# Patient Record
Sex: Female | Born: 1970 | Race: White | Hispanic: No | Smoking: Never smoker
Health system: Southern US, Community
[De-identification: ages and names within clinical notes are randomized; demographics above are authoritative.]

## PROBLEM LIST (undated history)

## (undated) DIAGNOSIS — T753XXA Motion sickness, initial encounter: Secondary | ICD-10-CM

## (undated) DIAGNOSIS — M1990 Unspecified osteoarthritis, unspecified site: Secondary | ICD-10-CM

## (undated) DIAGNOSIS — J302 Other seasonal allergic rhinitis: Secondary | ICD-10-CM

## (undated) DIAGNOSIS — Q639 Congenital malformation of kidney, unspecified: Secondary | ICD-10-CM

## (undated) DIAGNOSIS — I82409 Acute embolism and thrombosis of unspecified deep veins of unspecified lower extremity: Secondary | ICD-10-CM

## (undated) DIAGNOSIS — N3010 Interstitial cystitis (chronic) without hematuria: Secondary | ICD-10-CM

## (undated) DIAGNOSIS — D682 Hereditary deficiency of other clotting factors: Secondary | ICD-10-CM

## (undated) DIAGNOSIS — R42 Dizziness and giddiness: Secondary | ICD-10-CM

## (undated) DIAGNOSIS — F329 Major depressive disorder, single episode, unspecified: Secondary | ICD-10-CM

## (undated) DIAGNOSIS — F32A Depression, unspecified: Secondary | ICD-10-CM

## (undated) DIAGNOSIS — N83209 Unspecified ovarian cyst, unspecified side: Secondary | ICD-10-CM

---
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
## 2007-02-25 ENCOUNTER — Ambulatory Visit: Payer: Self-pay | Admitting: Family Medicine

## 2007-04-11 ENCOUNTER — Ambulatory Visit: Payer: Self-pay | Admitting: Specialist

## 2007-04-12 ENCOUNTER — Other Ambulatory Visit: Payer: Self-pay

## 2007-04-12 ENCOUNTER — Inpatient Hospital Stay: Payer: Self-pay | Admitting: Endocrinology

## 2007-05-05 ENCOUNTER — Ambulatory Visit: Payer: Self-pay | Admitting: Endocrinology

## 2007-10-17 ENCOUNTER — Encounter: Payer: Self-pay | Admitting: Maternal & Fetal Medicine

## 2008-05-02 ENCOUNTER — Ambulatory Visit: Payer: Self-pay | Admitting: Endocrinology

## 2008-07-20 ENCOUNTER — Emergency Department: Payer: Self-pay | Admitting: Emergency Medicine

## 2009-12-23 ENCOUNTER — Ambulatory Visit: Payer: Self-pay | Admitting: Unknown Physician Specialty

## 2010-01-06 ENCOUNTER — Ambulatory Visit: Payer: Self-pay | Admitting: Urology

## 2010-01-07 ENCOUNTER — Ambulatory Visit: Payer: Self-pay | Admitting: Urology

## 2012-11-10 ENCOUNTER — Ambulatory Visit: Payer: Self-pay | Admitting: Hematology and Oncology

## 2012-11-10 ENCOUNTER — Emergency Department: Payer: Self-pay | Admitting: Emergency Medicine

## 2012-11-10 ENCOUNTER — Ambulatory Visit: Payer: Self-pay | Admitting: General Practice

## 2012-11-10 LAB — BASIC METABOLIC PANEL
BUN: 16 mg/dL (ref 7–18)
Calcium, Total: 9.3 mg/dL (ref 8.5–10.1)
Chloride: 103 mmol/L (ref 98–107)
Co2: 32 mmol/L (ref 21–32)
EGFR (African American): >60
Glucose: 88 mg/dL (ref 65–99)
Sodium: 139 mmol/L (ref 136–145)

## 2012-11-10 LAB — PROTIME-INR
INR: 0.9
Prothrombin Time: 12.4 secs (ref 11.5–14.7)

## 2012-11-10 LAB — CBC
HCT: 39 % (ref 35.0–47.0)
Platelet: 240 10*3/uL (ref 150–440)
RBC: 4.27 10*6/uL (ref 3.80–5.20)

## 2012-11-10 LAB — APTT: Activated PTT: 27.5 secs (ref 23.6–35.9)

## 2012-11-16 ENCOUNTER — Ambulatory Visit: Payer: Self-pay | Admitting: Hematology and Oncology

## 2012-12-11 ENCOUNTER — Ambulatory Visit: Payer: Self-pay | Admitting: Hematology and Oncology

## 2013-02-13 ENCOUNTER — Ambulatory Visit: Payer: Self-pay | Admitting: General Practice

## 2013-06-26 ENCOUNTER — Ambulatory Visit: Payer: Self-pay | Admitting: Hematology and Oncology

## 2013-06-26 LAB — CBC CANCER CENTER
Basophil #: 0.1 x10 3/mm (ref 0.0–0.1)
Basophil %: 1.3 %
Eosinophil %: 2 %
HCT: 39.5 % (ref 35.0–47.0)
HGB: 13.3 g/dL (ref 12.0–16.0)
Lymphocyte #: 1.6 x10 3/mm (ref 1.0–3.6)
Lymphocyte %: 21.6 %
MCH: 31.4 pg (ref 26.0–34.0)
MCHC: 33.8 g/dL (ref 32.0–36.0)
MCV: 93 fL (ref 80–100)
Monocyte %: 5 %
Neutrophil #: 5.2 x10 3/mm (ref 1.4–6.5)
Neutrophil %: 70.1 %
RDW: 12.1 % (ref 11.5–14.5)

## 2013-06-26 LAB — COMPREHENSIVE METABOLIC PANEL
BUN: 11 mg/dL (ref 7–18)
Co2: 30 mmol/L (ref 21–32)
Creatinine: 1.01 mg/dL (ref 0.60–1.30)
EGFR (African American): >60
EGFR (Non-African Amer.): >60
Glucose: 104 mg/dL — ABNORMAL HIGH (ref 65–99)
Potassium: 3.9 mmol/L (ref 3.5–5.1)
SGOT(AST): 23 U/L (ref 15–37)
SGPT (ALT): 24 U/L (ref 12–78)
Sodium: 142 mmol/L (ref 136–145)

## 2013-07-13 ENCOUNTER — Ambulatory Visit: Payer: Self-pay | Admitting: Hematology and Oncology

## 2013-08-28 ENCOUNTER — Ambulatory Visit: Payer: Self-pay | Admitting: Hematology and Oncology

## 2013-08-28 LAB — CBC CANCER CENTER
BASOS PCT: 1 %
Basophil #: 0.1 x10 3/mm (ref 0.0–0.1)
Eosinophil #: 0 x10 3/mm (ref 0.0–0.7)
Eosinophil %: 0.3 %
HCT: 41.5 % (ref 35.0–47.0)
HGB: 14 g/dL (ref 12.0–16.0)
LYMPHS ABS: 1.7 x10 3/mm (ref 1.0–3.6)
LYMPHS PCT: 19.1 %
MCH: 31.1 pg (ref 26.0–34.0)
MCHC: 33.7 g/dL (ref 32.0–36.0)
MCV: 92 fL (ref 80–100)
MONO ABS: 0.4 x10 3/mm (ref 0.2–0.9)
Monocyte %: 4.7 %
Neutrophil #: 6.6 x10 3/mm — ABNORMAL HIGH (ref 1.4–6.5)
Neutrophil %: 74.9 %
PLATELETS: 245 x10 3/mm (ref 150–440)
RBC: 4.49 10*6/uL (ref 3.80–5.20)
RDW: 12.2 % (ref 11.5–14.5)
WBC: 8.8 x10 3/mm (ref 3.6–11.0)

## 2013-09-10 ENCOUNTER — Ambulatory Visit: Payer: Self-pay | Admitting: Hematology and Oncology

## 2013-11-19 LAB — CBC
HCT: 44.4 % (ref 35.0–47.0)
HGB: 15.2 g/dL (ref 12.0–16.0)
MCH: 31.6 pg (ref 26.0–34.0)
MCHC: 34.1 g/dL (ref 32.0–36.0)
MCV: 93 fL (ref 80–100)
PLATELETS: 258 10*3/uL (ref 150–440)
RBC: 4.79 10*6/uL (ref 3.80–5.20)
RDW: 12.3 % (ref 11.5–14.5)
WBC: 8.8 10*3/uL (ref 3.6–11.0)

## 2013-11-19 LAB — COMPREHENSIVE METABOLIC PANEL
ALK PHOS: 65 U/L
ALT: 17 U/L (ref 12–78)
ANION GAP: 6 — AB (ref 7–16)
Albumin: 4 g/dL (ref 3.4–5.0)
BUN: 14 mg/dL (ref 7–18)
Bilirubin,Total: 1 mg/dL (ref 0.2–1.0)
CHLORIDE: 99 mmol/L (ref 98–107)
CREATININE: 1.02 mg/dL (ref 0.60–1.30)
Calcium, Total: 9.3 mg/dL (ref 8.5–10.1)
Co2: 31 mmol/L (ref 21–32)
EGFR (Non-African Amer.): >60
GLUCOSE: 92 mg/dL (ref 65–99)
Osmolality: 272 (ref 275–301)
Potassium: 3.6 mmol/L (ref 3.5–5.1)
SGOT(AST): 22 U/L (ref 15–37)
SODIUM: 136 mmol/L (ref 136–145)
Total Protein: 7.7 g/dL (ref 6.4–8.2)

## 2013-11-19 LAB — DRUG SCREEN, URINE

## 2013-11-19 LAB — URINALYSIS, COMPLETE
Bilirubin,UR: NEGATIVE
GLUCOSE, UR: NEGATIVE mg/dL (ref 0–75)
Ketone: NEGATIVE
LEUKOCYTE ESTERASE: NEGATIVE
NITRITE: NEGATIVE
Ph: 6 (ref 4.5–8.0)
Protein: NEGATIVE
RBC,UR: <1 /HPF (ref 0–5)
Specific Gravity: 1.001 (ref 1.003–1.030)
Squamous Epithelial: 1
WBC UR: 1 /HPF (ref 0–5)

## 2013-11-19 LAB — ETHANOL
Ethanol %: <0.003 % (ref 0.000–0.080)
Ethanol: <3 mg/dL

## 2013-11-19 LAB — SALICYLATE LEVEL

## 2013-11-19 LAB — ACETAMINOPHEN LEVEL: Acetaminophen: <2 ug/mL

## 2013-11-20 ENCOUNTER — Inpatient Hospital Stay: Payer: Self-pay | Admitting: Psychiatry

## 2013-11-23 LAB — HEMOGLOBIN: HGB: 13.2 g/dL (ref 12.0–16.0)

## 2013-11-23 LAB — CREATININE, SERUM
Creatinine: 0.8 mg/dL (ref 0.60–1.30)
EGFR (Non-African Amer.): >60

## 2013-12-28 ENCOUNTER — Ambulatory Visit: Payer: Self-pay | Admitting: Hematology and Oncology

## 2013-12-28 LAB — CBC CANCER CENTER
BASOS ABS: 0.1 x10 3/mm (ref 0.0–0.1)
Basophil %: 1.2 %
EOS PCT: 2.2 %
Eosinophil #: 0.1 x10 3/mm (ref 0.0–0.7)
HCT: 38.9 % (ref 35.0–47.0)
HGB: 13 g/dL (ref 12.0–16.0)
Lymphocyte #: 1.6 x10 3/mm (ref 1.0–3.6)
Lymphocyte %: 26.1 %
MCH: 31.2 pg (ref 26.0–34.0)
MCHC: 33.3 g/dL (ref 32.0–36.0)
MCV: 94 fL (ref 80–100)
Monocyte #: 0.4 x10 3/mm (ref 0.2–0.9)
Monocyte %: 6.9 %
NEUTROS ABS: 3.8 x10 3/mm (ref 1.4–6.5)
NEUTROS PCT: 63.6 %
PLATELETS: 222 x10 3/mm (ref 150–440)
RBC: 4.16 10*6/uL (ref 3.80–5.20)
RDW: 12.2 % (ref 11.5–14.5)
WBC: 6 x10 3/mm (ref 3.6–11.0)

## 2014-01-10 ENCOUNTER — Ambulatory Visit: Payer: Self-pay | Admitting: Hematology and Oncology

## 2014-11-03 NOTE — Discharge Summary (Signed)
PATIENT NAME:  Jocelyn Palmer, [name] MR#:  [id number] DATE OF BIRTH:  [birth year]  DATE OF ADMISSION:  11/20/2013 DATE OF DISCHARGE:  11/24/2013  HOSPITAL COURSE: See dictated history and physical for details of admission. This 44 year old woman presented to the hospital with depression, anxiety, feeling out of control, symptoms that vaguely suggested the possibility of suicidality. She was feeling like she was having a breakdown. In the hospital, the patient did not engage in any suicidal or violent behavior. She was cooperative with medication, treatment, as well as individual and group psychotherapy. She appears to have symptoms of severe anxiety, as well as depression. I do not think she has a psychotic disorder. I think the symptoms that she perceives as possibly psychotic were more dissociative and extreme obsessive anxiety. She has been treated here with serotonin reuptake inhibitors and Seroquel as primary medications. She has tolerated them well. She had complaints regularly of sleeping poorly, but last night slept much better on the increased dose of Seroquel. I have discussed the overall treatment plan with the patient, and I think that although she continues to have some symptoms, that she would be better off being discharged. She has a safe home to return to where she feels secure and has a supportive husband. She has outpatient treatment already in place. She is not abusing substances at home. I think that the inpatient environment is probably causing more stress for her than is necessary. She agrees with all of this. She has been educated about medicine and the importance of staying compliant and staying compliant with outpatient treatment and focusing on calming down and improving herself. She gave me some FMLA paperwork while she was here, and I have filled that out suggesting she will need to be out for up to 3 months.   DISPOSITION: Discharge home with family. Follow up at [facility] with Dr.  Jennelle Humanottle.   DISCHARGE MEDICATIONS: Seroquel 300 mg p.o. at bedtime, temazepam 15 mg at night p.r.n. sleep, fluoxetine 20 mg a day, and Xarelto 20 mg once a day.   LABORATORY RESULTS: As noted previously, admission labs largely unremarkable. Drug screen negative. Urinalysis borderline. CBC normal. Chemistry panel normal.   MENTAL STATUS EXAMINATION AT DISCHARGE: Neatly dressed and groomed woman who looks her stated age. Cooperative during the interview. Good eye contact. Psychomotor activity a little restricted. Speech slightly decreased in total amount but normal in tone, easy to understand. Affect still slightly blunted and anxious. Mood stated as nervous. Thoughts are lucid without loosening of associations or delusions. Denies auditory or visual hallucinations. Denies any suicidal or homicidal ideation. Articulates positive plans for the future and things to live for. Judgment and insight improved. Intelligence normal. Short and long-term memory intact. Normal fund of knowledge. Alert and oriented x 4.   DIAGNOSIS, PRINCIPAL AND PRIMARY:  AXIS I: Major depression, recurrent, severe.   SECONDARY DIAGNOSES: AXIS I: Generalized anxiety disorder.  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  AXIS IV: Moderate to severe from being out of work and the level of illness.  AXIS V: Functioning at time of discharge 50.   ____________________________ Audery AmelJohn T. Zyen Triggs, MD jtc:jcm D: 11/24/2013 12:48:09 ET T: 11/24/2013 21:00:47 ET JOB#: [id number]  cc: Audery AmelJohn T. Tamiya Colello, MD, <Dictator> Audery AmelJOHN T Eveleen Mcnear MD ELECTRONICALLY SIGNED 12/14/2013 11:32
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
## 2015-01-02 ENCOUNTER — Emergency Department (HOSPITAL_COMMUNITY): Payer: 59

## 2015-01-02 ENCOUNTER — Emergency Department (HOSPITAL_COMMUNITY)
Admission: EM | Admit: 2015-01-02 | Discharge: 2015-01-03 | Disposition: A | Discharge status: Home / Self Care | Payer: 59 | Attending: Emergency Medicine | Admitting: Emergency Medicine

## 2015-01-02 ENCOUNTER — Encounter (HOSPITAL_COMMUNITY): Payer: Self-pay | Admitting: Nurse Practitioner

## 2015-01-02 DIAGNOSIS — S060X9A Concussion with loss of consciousness of unspecified duration, initial encounter: Secondary | ICD-10-CM | POA: Diagnosis not present

## 2015-01-02 DIAGNOSIS — Y9389 Activity, other specified: Secondary | ICD-10-CM | POA: Insufficient documentation

## 2015-01-02 DIAGNOSIS — S01511A Laceration without foreign body of lip, initial encounter: Secondary | ICD-10-CM | POA: Insufficient documentation

## 2015-01-02 DIAGNOSIS — S50312A Abrasion of left elbow, initial encounter: Secondary | ICD-10-CM | POA: Diagnosis not present

## 2015-01-02 DIAGNOSIS — Y92410 Unspecified street and highway as the place of occurrence of the external cause: Secondary | ICD-10-CM | POA: Insufficient documentation

## 2015-01-02 DIAGNOSIS — R110 Nausea: Secondary | ICD-10-CM | POA: Insufficient documentation

## 2015-01-02 DIAGNOSIS — S80212A Abrasion, left knee, initial encounter: Secondary | ICD-10-CM | POA: Diagnosis not present

## 2015-01-02 DIAGNOSIS — Z86718 Personal history of other venous thrombosis and embolism: Secondary | ICD-10-CM | POA: Diagnosis not present

## 2015-01-02 DIAGNOSIS — S0181XA Laceration without foreign body of other part of head, initial encounter: Secondary | ICD-10-CM

## 2015-01-02 DIAGNOSIS — S80211A Abrasion, right knee, initial encounter: Secondary | ICD-10-CM | POA: Diagnosis not present

## 2015-01-02 DIAGNOSIS — S0990XA Unspecified injury of head, initial encounter: Secondary | ICD-10-CM | POA: Diagnosis present

## 2015-01-02 DIAGNOSIS — F329 Major depressive disorder, single episode, unspecified: Secondary | ICD-10-CM | POA: Diagnosis not present

## 2015-01-02 DIAGNOSIS — S01112A Laceration without foreign body of left eyelid and periocular area, initial encounter: Secondary | ICD-10-CM | POA: Diagnosis not present

## 2015-01-02 DIAGNOSIS — Z79899 Other long term (current) drug therapy: Secondary | ICD-10-CM | POA: Insufficient documentation

## 2015-01-02 DIAGNOSIS — S0992XA Unspecified injury of nose, initial encounter: Secondary | ICD-10-CM | POA: Insufficient documentation

## 2015-01-02 DIAGNOSIS — Z862 Personal history of diseases of the blood and blood-forming organs and certain disorders involving the immune mechanism: Secondary | ICD-10-CM | POA: Insufficient documentation

## 2015-01-02 DIAGNOSIS — S40812A Abrasion of left upper arm, initial encounter: Secondary | ICD-10-CM | POA: Diagnosis not present

## 2015-01-02 DIAGNOSIS — R52 Pain, unspecified: Secondary | ICD-10-CM

## 2015-01-02 DIAGNOSIS — Y999 Unspecified external cause status: Secondary | ICD-10-CM | POA: Insufficient documentation

## 2015-01-02 HISTORY — DX: Major depressive disorder, single episode, unspecified: F32.9

## 2015-01-02 HISTORY — DX: Depression, unspecified: F32.A

## 2015-01-02 HISTORY — DX: Hereditary deficiency of other clotting factors: D68.2

## 2015-01-02 LAB — CBC
HCT: 38.3 % (ref 36.0–46.0)
HEMOGLOBIN: 13 g/dL (ref 12.0–15.0)
MCH: 30.8 pg (ref 26.0–34.0)
MCHC: 33.9 g/dL (ref 30.0–36.0)
MCV: 90.8 fL (ref 78.0–100.0)
Platelets: 215 10*3/uL (ref 150–400)
RBC: 4.22 MIL/uL (ref 3.87–5.11)
RDW: 12.1 % (ref 11.5–15.5)
WBC: 7.7 10*3/uL (ref 4.0–10.5)

## 2015-01-02 LAB — COMPREHENSIVE METABOLIC PANEL
ALT: 29 U/L (ref 14–54)
ANION GAP: 12 (ref 5–15)
AST: 41 U/L (ref 15–41)
Albumin: 4.2 g/dL (ref 3.5–5.0)
Alkaline Phosphatase: 58 U/L (ref 38–126)
BUN: 17 mg/dL (ref 6–20)
CALCIUM: 9.6 mg/dL (ref 8.9–10.3)
CO2: 24 mmol/L (ref 22–32)
CREATININE: 1.3 mg/dL — AB (ref 0.44–1.00)
Chloride: 103 mmol/L (ref 101–111)
GFR calc non Af Amer: 49 mL/min — ABNORMAL LOW (ref >60)
GFR, EST AFRICAN AMERICAN: 57 mL/min — AB (ref >60)
GLUCOSE: 147 mg/dL — AB (ref 65–99)
Potassium: 4.1 mmol/L (ref 3.5–5.1)
Sodium: 139 mmol/L (ref 135–145)
TOTAL PROTEIN: 7 g/dL (ref 6.5–8.1)
Total Bilirubin: 0.5 mg/dL (ref 0.3–1.2)

## 2015-01-02 LAB — SAMPLE TO BLOOD BANK

## 2015-01-02 LAB — I-STAT CG4 LACTIC ACID, ED: Lactic Acid, Venous: 2.13 mmol/L (ref 0.5–2.0)

## 2015-01-02 LAB — ETHANOL: Alcohol, Ethyl (B): <5 mg/dL (ref <5)

## 2015-01-02 LAB — PROTIME-INR
INR: 0.98 (ref 0.00–1.49)
Prothrombin Time: 13.2 seconds (ref 11.6–15.2)

## 2015-01-02 MED ORDER — TETANUS-DIPHTH-ACELL PERTUSSIS 5-2.5-18.5 LF-MCG/0.5 IM SUSP
0.5000 mL | Freq: Once | INTRAMUSCULAR | Status: AC
  Administered 2015-01-02: 0.5 mL via INTRAMUSCULAR
  Filled 2015-01-02: qty 0.5

## 2015-01-02 MED ORDER — SODIUM CHLORIDE 0.9 % IV BOLUS (SEPSIS)
1000.0000 mL | Freq: Once | INTRAVENOUS | Status: AC
  Administered 2015-01-02: 1000 mL via INTRAVENOUS

## 2015-01-02 MED ORDER — ONDANSETRON HCL 4 MG/2ML IJ SOLN
4.0000 mg | Freq: Once | INTRAMUSCULAR | Status: AC
  Administered 2015-01-02: 4 mg via INTRAVENOUS
  Filled 2015-01-02: qty 2

## 2015-01-02 MED ORDER — LIDOCAINE-EPINEPHRINE 1 %-1:100000 IJ SOLN
10.0000 mL | Freq: Once | INTRAMUSCULAR | Status: AC
  Administered 2015-01-02: 10 mL via INTRADERMAL
  Filled 2015-01-02: qty 1

## 2015-01-02 MED ORDER — ACETAMINOPHEN 500 MG PO TABS
1000.0000 mg | ORAL_TABLET | Freq: Once | ORAL | Status: AC
  Administered 2015-01-02: 1000 mg via ORAL
  Filled 2015-01-02: qty 2

## 2015-01-02 MED ORDER — ONDANSETRON HCL 4 MG/2ML IJ SOLN
4.0000 mg | Freq: Once | INTRAMUSCULAR | Status: AC
Start: 2015-01-02 — End: 2015-01-02
  Administered 2015-01-02: 4 mg via INTRAVENOUS
  Filled 2015-01-02: qty 2

## 2015-01-02 MED ORDER — MORPHINE SULFATE 2 MG/ML IJ SOLN
2.0000 mg | Freq: Once | INTRAMUSCULAR | Status: AC
  Administered 2015-01-02: 2 mg via INTRAVENOUS
  Filled 2015-01-02: qty 1

## 2015-01-02 NOTE — ED Notes (Signed)
Family at bedside. 

## 2015-01-02 NOTE — ED Notes (Signed)
Patient transported to CT 

## 2015-01-02 NOTE — ED Notes (Signed)
Pt returned with this RN from CT
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
## 2015-01-03 LAB — CDS SEROLOGY

## 2015-01-03 MED ORDER — ACETAMINOPHEN 500 MG PO TABS: 1000.0000 mg | ORAL_TABLET | Freq: Three times a day (TID) | ORAL | Status: AC | PRN

## 2015-01-03 NOTE — ED Notes (Signed)
Suture cart bedside. 

## 2015-01-03 NOTE — Discharge Instructions (Signed)
Absorbable Suture Repair Absorbable sutures (stitches) hold skin together so you can heal. Keep skin wounds clean and dry for the next 2 to 3 days. Then, you may gently wash your wound and dress it with an antibiotic ointment as recommended. As your wound begins to heal, the sutures are no longer needed, and they typically begin to fall off. This will take 7 to 10 days. After 10 days, if your sutures are loose, you can remove them by wiping with a clean gauze pad or a cotton ball. Do not pull your sutures out. They should wipe away easily. If after 10 days they do not easily wipe away, have your caregiver take them out. Absorbable sutures may be used deep in a wound to help hold it together. If these stitches are below the skin, the body will absorb them completely in 3 to 4 weeks.  You may need a tetanus shot if:  You cannot remember when you had your last tetanus shot.  You have never had a tetanus shot. If you get a tetanus shot, your arm may swell, get red, and feel warm to the touch. This is common and not a problem. If you need a tetanus shot and you choose not to have one, there is a rare chance of getting tetanus. Sickness from tetanus can be serious. SEEK IMMEDIATE MEDICAL CARE IF:  You have redness in the wound area.  The wound area feels hot to the touch.  You develop swelling in the wound area.  You develop pain.  There is fluid drainage from the wound. Document Released: 08/06/2004 Document Revised: 09/21/2011 Document Reviewed: 11/18/2010 [facility] Patient Information 2015 [place], [place]. This information is not intended to replace advice given to you by your health care provider. Make sure you discuss any questions you have with your health care provider. Laceration Care, Adult A laceration is a cut or lesion that goes through all layers of the skin and into the tissue just beneath the skin. TREATMENT  Some lacerations may not require closure. Some lacerations may not be able  to be closed due to an increased risk of infection. It is important to see your caregiver as soon as possible after an injury to minimize the risk of infection and maximize the opportunity for successful closure. If closure is appropriate, pain medicines may be given, if needed. The wound will be cleaned to help prevent infection. Your caregiver will use stitches (sutures), staples, wound glue (adhesive), or skin adhesive strips to repair the laceration. These tools bring the skin edges together to allow for faster healing and a better cosmetic outcome. However, all wounds will heal with a scar. Once the wound has healed, scarring can be minimized by covering the wound with sunscreen during the day for 1 full year. HOME CARE INSTRUCTIONS  For sutures or staples:  Keep the wound clean and dry.  If you were given a bandage (dressing), you should change it at least once a day. Also, change the dressing if it becomes wet or dirty, or as directed by your caregiver.  Wash the wound with soap and water 2 times a day. Rinse the wound off with water to remove all soap. Pat the wound dry with a clean towel.  After cleaning, apply a thin layer of the antibiotic ointment as recommended by your caregiver. This will help prevent infection and keep the dressing from sticking.  You may shower as usual after the first 24 hours. Do not soak the wound in  water until the sutures are removed.  Only take over-the-counter or prescription medicines for pain, discomfort, or fever as directed by your caregiver.  Get your sutures or staples removed as directed by your caregiver. For skin adhesive strips:  Keep the wound clean and dry.  Do not get the skin adhesive strips wet. You may bathe carefully, using caution to keep the wound dry.  If the wound gets wet, pat it dry with a clean towel.  Skin adhesive strips will fall off on their own. You may trim the strips as the wound heals. Do not remove skin adhesive strips  that are still stuck to the wound. They will fall off in time. For wound adhesive:  You may briefly wet your wound in the shower or bath. Do not soak or scrub the wound. Do not swim. Avoid periods of heavy perspiration until the skin adhesive has fallen off on its own. After showering or bathing, gently pat the wound dry with a clean towel.  Do not apply liquid medicine, cream medicine, or ointment medicine to your wound while the skin adhesive is in place. This may loosen the film before your wound is healed.  If a dressing is placed over the wound, be careful not to apply tape directly over the skin adhesive. This may cause the adhesive to be pulled off before the wound is healed.  Avoid prolonged exposure to sunlight or tanning lamps while the skin adhesive is in place. Exposure to ultraviolet light in the first year will darken the scar.  The skin adhesive will usually remain in place for 5 to 10 days, then naturally fall off the skin. Do not pick at the adhesive film. You may need a tetanus shot if:  You cannot remember when you had your last tetanus shot.  You have never had a tetanus shot. If you get a tetanus shot, your arm may swell, get red, and feel warm to the touch. This is common and not a problem. If you need a tetanus shot and you choose not to have one, there is a rare chance of getting tetanus. Sickness from tetanus can be serious. SEEK MEDICAL CARE IF:   You have redness, swelling, or increasing pain in the wound.  You see a red line that goes away from the wound.  You have yellowish-white fluid (pus) coming from the wound.  You have a fever.  You notice a bad smell coming from the wound or dressing.  Your wound breaks open before or after sutures have been removed.  You notice something coming out of the wound such as wood or glass.  Your wound is on your hand or foot and you cannot move a finger or toe. SEEK IMMEDIATE MEDICAL CARE IF:   Your pain is not  controlled with prescribed medicine.  You have severe swelling around the wound causing pain and numbness or a change in color in your arm, hand, leg, or foot.  Your wound splits open and starts bleeding.  You have worsening numbness, weakness, or loss of function of any joint around or beyond the wound.  You develop painful lumps near the wound or on the skin anywhere on your body. MAKE SURE YOU:   Understand these instructions.  Will watch your condition.  Will get help right away if you are not doing well or get worse. Document Released: 06/29/2005 Document Revised: 09/21/2011 Document Reviewed: 12/23/2010 [facility] Patient Information 2015 [place], [place]. This information is not intended to replace advice  given to you by your health care provider. Make sure you discuss any questions you have with your health care provider.

## 2015-01-03 NOTE — ED Notes (Signed)
Dr. Cardama at bedside.  

## 2015-01-03 NOTE — ED Notes (Signed)
Dr.Cardama at bedside; dermabond given

## 2015-01-08 ENCOUNTER — Ambulatory Visit
Admission: EM | Admit: 2015-01-08 | Discharge: 2015-01-08 | Disposition: A | Discharge status: Home / Self Care | Payer: 59

## 2015-01-08 HISTORY — DX: Unspecified ovarian cyst, unspecified side: N83.209

## 2015-01-08 HISTORY — DX: Interstitial cystitis (chronic) without hematuria: N30.10

## 2015-01-08 NOTE — ED Notes (Signed)
For suture removal above left eyebrow

## 2015-01-08 NOTE — ED Notes (Signed)
Family at bedside. Pt. Wounds checked. Has 2 disolvable sutures upper inside lip remain. Running sutures removed from left eyebrow easily-well healed. Remains with occasional dizziness. Dr. Judd Gaudieronty talked with patient and family
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
## 2015-01-10 ENCOUNTER — Other Ambulatory Visit: Payer: Self-pay | Admitting: Internal Medicine

## 2015-01-10 DIAGNOSIS — R42 Dizziness and giddiness: Secondary | ICD-10-CM

## 2015-01-18 ENCOUNTER — Ambulatory Visit
Admission: RE | Admit: 2015-01-18 | Discharge: 2015-01-18 | Disposition: A | Discharge status: Home / Self Care | Payer: 59 | Source: Ambulatory Visit | Attending: Internal Medicine | Admitting: Internal Medicine

## 2015-01-18 DIAGNOSIS — S0990XA Unspecified injury of head, initial encounter: Secondary | ICD-10-CM | POA: Diagnosis present

## 2015-01-18 DIAGNOSIS — R42 Dizziness and giddiness: Secondary | ICD-10-CM | POA: Diagnosis not present

## 2015-02-08 ENCOUNTER — Other Ambulatory Visit: Payer: Self-pay | Admitting: Orthopedic Surgery

## 2015-02-08 DIAGNOSIS — S46002A Unspecified injury of muscle(s) and tendon(s) of the rotator cuff of left shoulder, initial encounter: Secondary | ICD-10-CM

## 2015-02-15 ENCOUNTER — Ambulatory Visit
Admission: RE | Admit: 2015-02-15 | Discharge: 2015-02-15 | Disposition: A | Discharge status: Home / Self Care | Payer: 59 | Source: Ambulatory Visit | Attending: Orthopedic Surgery | Admitting: Orthopedic Surgery

## 2015-02-15 DIAGNOSIS — M67814 Other specified disorders of tendon, left shoulder: Secondary | ICD-10-CM | POA: Diagnosis not present

## 2015-02-15 DIAGNOSIS — M94212 Chondromalacia, left shoulder: Secondary | ICD-10-CM | POA: Diagnosis not present

## 2015-02-15 DIAGNOSIS — M25412 Effusion, left shoulder: Secondary | ICD-10-CM | POA: Diagnosis not present

## 2015-02-15 DIAGNOSIS — Y9355 Activity, bike riding: Secondary | ICD-10-CM | POA: Insufficient documentation

## 2015-02-15 DIAGNOSIS — S46002A Unspecified injury of muscle(s) and tendon(s) of the rotator cuff of left shoulder, initial encounter: Secondary | ICD-10-CM

## 2015-02-15 DIAGNOSIS — S4992XA Unspecified injury of left shoulder and upper arm, initial encounter: Secondary | ICD-10-CM | POA: Diagnosis present

## 2015-02-15 DIAGNOSIS — X58XXXA Exposure to other specified factors, initial encounter: Secondary | ICD-10-CM | POA: Diagnosis not present

## 2015-02-15 DIAGNOSIS — S46812A Strain of other muscles, fascia and tendons at shoulder and upper arm level, left arm, initial encounter: Secondary | ICD-10-CM | POA: Insufficient documentation

## 2015-03-11 ENCOUNTER — Encounter: Payer: Self-pay | Admitting: *Deleted

## 2015-03-20 ENCOUNTER — Ambulatory Visit: Payer: 59 | Admitting: Anesthesiology

## 2015-03-20 ENCOUNTER — Encounter
Admission: RE | Disposition: A | Discharge status: Home / Self Care | Payer: Self-pay | Source: Ambulatory Visit | Attending: Surgery

## 2015-03-20 ENCOUNTER — Ambulatory Visit
Admission: RE | Admit: 2015-03-20 | Discharge: 2015-03-20 | Disposition: A | Discharge status: Home / Self Care | Payer: 59 | Source: Ambulatory Visit | Attending: Surgery | Admitting: Surgery

## 2015-03-20 ENCOUNTER — Encounter: Payer: Self-pay | Admitting: Anesthesiology

## 2015-03-20 DIAGNOSIS — M7502 Adhesive capsulitis of left shoulder: Secondary | ICD-10-CM | POA: Diagnosis not present

## 2015-03-20 DIAGNOSIS — F419 Anxiety disorder, unspecified: Secondary | ICD-10-CM | POA: Diagnosis not present

## 2015-03-20 DIAGNOSIS — G43909 Migraine, unspecified, not intractable, without status migrainosus: Secondary | ICD-10-CM | POA: Diagnosis not present

## 2015-03-20 DIAGNOSIS — M75102 Unspecified rotator cuff tear or rupture of left shoulder, not specified as traumatic: Secondary | ICD-10-CM | POA: Insufficient documentation

## 2015-03-20 DIAGNOSIS — Z882 Allergy status to sulfonamides status: Secondary | ICD-10-CM | POA: Insufficient documentation

## 2015-03-20 DIAGNOSIS — F329 Major depressive disorder, single episode, unspecified: Secondary | ICD-10-CM | POA: Insufficient documentation

## 2015-03-20 DIAGNOSIS — M7542 Impingement syndrome of left shoulder: Secondary | ICD-10-CM | POA: Diagnosis present

## 2015-03-20 DIAGNOSIS — Z79899 Other long term (current) drug therapy: Secondary | ICD-10-CM | POA: Diagnosis not present

## 2015-03-20 DIAGNOSIS — Z8349 Family history of other endocrine, nutritional and metabolic diseases: Secondary | ICD-10-CM | POA: Insufficient documentation

## 2015-03-20 HISTORY — DX: Other seasonal allergic rhinitis: J30.2

## 2015-03-20 HISTORY — DX: Congenital malformation of kidney, unspecified: Q63.9

## 2015-03-20 HISTORY — DX: Motion sickness, initial encounter: T75.3XXA

## 2015-03-20 HISTORY — DX: Dizziness and giddiness: R42

## 2015-03-20 HISTORY — PX: SHOULDER ARTHROSCOPY WITH ROTATOR CUFF REPAIR: SHX5685

## 2015-03-20 HISTORY — DX: Acute embolism and thrombosis of unspecified deep veins of unspecified lower extremity: I82.409

## 2015-03-20 HISTORY — DX: Unspecified osteoarthritis, unspecified site: M19.90

## 2015-03-20 SURGERY — ARTHROSCOPY, SHOULDER, WITH ROTATOR CUFF REPAIR
Anesthesia: Regional | Laterality: Left | Wound class: Clean

## 2015-03-20 MED ORDER — OXYCODONE HCL 5 MG PO TABS: 5.0000 mg | ORAL_TABLET | ORAL | Status: DC | PRN

## 2015-03-20 MED ORDER — ROPIVACAINE HCL 5 MG/ML IJ SOLN
INTRAMUSCULAR | Status: DC | PRN
  Administered 2015-03-20 (×2): 20 mL via PERINEURAL

## 2015-03-20 MED ORDER — DEXAMETHASONE SODIUM PHOSPHATE 4 MG/ML IJ SOLN
INTRAMUSCULAR | Status: DC | PRN
  Administered 2015-03-20: 8 mg via INTRAVENOUS

## 2015-03-20 MED ORDER — FENTANYL CITRATE (PF) 100 MCG/2ML IJ SOLN
INTRAMUSCULAR | Status: DC | PRN
  Administered 2015-03-20: 100 ug via INTRAVENOUS

## 2015-03-20 MED ORDER — LIDOCAINE HCL (CARDIAC) 20 MG/ML IV SOLN
INTRAVENOUS | Status: DC | PRN
  Administered 2015-03-20: 40 mg via INTRATRACHEAL

## 2015-03-20 MED ORDER — LACTATED RINGERS IV SOLN
INTRAVENOUS | Status: DC
  Administered 2015-03-20: 13:00:00 via INTRAVENOUS

## 2015-03-20 MED ORDER — MIDAZOLAM HCL 2 MG/2ML IJ SOLN
INTRAMUSCULAR | Status: DC | PRN
  Administered 2015-03-20: 2 mg via INTRAVENOUS

## 2015-03-20 MED ORDER — PROPOFOL 10 MG/ML IV BOLUS
INTRAVENOUS | Status: DC | PRN
  Administered 2015-03-20: 150 mg via INTRAVENOUS
  Administered 2015-03-20: 50 mg via INTRAVENOUS

## 2015-03-20 MED ORDER — BUPIVACAINE-EPINEPHRINE (PF) 0.5% -1:200000 IJ SOLN
INTRAMUSCULAR | Status: DC | PRN
  Administered 2015-03-20: 22 mL via PERINEURAL

## 2015-03-20 MED ORDER — ONDANSETRON HCL 4 MG/2ML IJ SOLN
INTRAMUSCULAR | Status: DC | PRN
  Administered 2015-03-20: 4 mg via INTRAVENOUS

## 2015-03-20 MED ORDER — CEFAZOLIN SODIUM-DEXTROSE 2-3 GM-% IV SOLR
2.0000 g | Freq: Once | INTRAVENOUS | Status: AC
  Administered 2015-03-20: 2 g via INTRAVENOUS

## 2015-03-20 SURGICAL SUPPLY — 54 items
ANCHOR JUGGERKNOT WTAP NDL 2.9 (Anchor) ×4 IMPLANT
BENZOIN TINCTURE PRP APPL 2/3 (GAUZE/BANDAGES/DRESSINGS) IMPLANT
BIT DRILL JUGRKNT W/NDL BIT2.9 (DRILL) ×1 IMPLANT
BLADE FULL RADIUS 3.5 (BLADE) ×2 IMPLANT
BLADE SHAVER 4.5X7 STR FR (MISCELLANEOUS) IMPLANT
BNDG COHESIVE 4X5 TAN STRL (GAUZE/BANDAGES/DRESSINGS) ×2 IMPLANT
BNDG ESMARK 4X12 TAN STRL LF (GAUZE/BANDAGES/DRESSINGS) IMPLANT
BUR ACROMIONIZER 4.0 (BURR) ×2 IMPLANT
BUR BR 5.5 WIDE MOUTH (BURR) IMPLANT
CANISTER SUCT 1200ML W/VALVE (MISCELLANEOUS) ×2 IMPLANT
CHLORAPREP W/TINT 26ML (MISCELLANEOUS) ×4 IMPLANT
COVER LIGHT HANDLE UNIVERSAL (MISCELLANEOUS) ×2 IMPLANT
DRAPE FLUOR MINI C-ARM 54X84 (DRAPES) IMPLANT
DRAPE IMP U-DRAPE 54X76 (DRAPES) ×4 IMPLANT
DRAPE U-SHAPE 48X52 POLY STRL (PACKS) IMPLANT
DRILL JUGGERKNOT W/NDL BIT 2.9 (DRILL) ×2
GAUZE PETRO XEROFOAM 1X8 (MISCELLANEOUS) ×2 IMPLANT
GAUZE SPONGE 4X4 12PLY STRL (GAUZE/BANDAGES/DRESSINGS) ×2 IMPLANT
GLOVE BIO SURGEON STRL SZ8 (GLOVE) ×4 IMPLANT
GLOVE INDICATOR 8.0 STRL GRN (GLOVE) ×2 IMPLANT
GOWN STRL REUS W/ TWL LRG LVL3 (GOWN DISPOSABLE) ×2 IMPLANT
GOWN STRL REUS W/ TWL XL LVL3 (GOWN DISPOSABLE) ×1 IMPLANT
GOWN STRL REUS W/TWL LRG LVL3 (GOWN DISPOSABLE) ×2
GOWN STRL REUS W/TWL XL LVL3 (GOWN DISPOSABLE) ×1
IV LACTATED RINGER IRRG 3000ML (IV SOLUTION) ×2
IV LR IRRIG 3000ML ARTHROMATIC (IV SOLUTION) ×2 IMPLANT
KIT CANNULA 8X76-LX IN CANNULA (CANNULA) ×2 IMPLANT
MANIFOLD 4PT FOR NEPTUNE1 (MISCELLANEOUS) ×2 IMPLANT
MAT BLUE FLOOR 46X72 FLO (MISCELLANEOUS) ×2 IMPLANT
NDL MAYO CATGUT SZ5 (NEEDLE)
NDL SUT 5 .5 CRC TPR PNT MAYO (NEEDLE) IMPLANT
NEEDLE HYPO 21X1.5 SAFETY (NEEDLE) ×4 IMPLANT
NS IRRIG 500ML POUR BTL (IV SOLUTION) IMPLANT
PACK ARTHROSCOPY SHOULDER (MISCELLANEOUS) ×2 IMPLANT
PACK EXTREMITY ARMC (MISCELLANEOUS) IMPLANT
PAD CAST CTTN 4X4 STRL (SOFTGOODS) IMPLANT
PAD GROUND ADULT SPLIT (MISCELLANEOUS) ×2 IMPLANT
PADDING CAST COTTON 4X4 STRL (SOFTGOODS)
SLING ULTRA II M (MISCELLANEOUS) ×2 IMPLANT
STAPLER SKIN PROX 35W (STAPLE) ×2 IMPLANT
STOCKINETTE IMPERVIOUS LG (DRAPES) ×2 IMPLANT
STRAP BODY AND KNEE 60X3 (MISCELLANEOUS) ×4 IMPLANT
STRIP CLOSURE SKIN 1/2X4 (GAUZE/BANDAGES/DRESSINGS) ×2 IMPLANT
SUT ETHIBOND 0 MO6 C/R (SUTURE) IMPLANT
SUT VIC AB 2-0 CT1 27 (SUTURE)
SUT VIC AB 2-0 CT1 TAPERPNT 27 (SUTURE) IMPLANT
SUT VIC AB 2-0 SH 27 (SUTURE) ×1
SUT VIC AB 2-0 SH 27XBRD (SUTURE) ×1 IMPLANT
SUT VIC AB 3-0 SH 27 (SUTURE)
SUT VIC AB 3-0 SH 27X BRD (SUTURE) IMPLANT
SYR 30ML LL (SYRINGE) IMPLANT
TAPE MICROFOAM 4IN (TAPE) ×2 IMPLANT
TUBING ARTHRO INFLOW-ONLY STRL (TUBING) ×2 IMPLANT
WAND HAND CNTRL MULTIVAC 90 (MISCELLANEOUS) ×2 IMPLANT
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
## 2015-03-20 NOTE — Transfer of Care (Signed)
Immediate Anesthesia Transfer of Care Note  Patient: Jocelyn Palmer  Procedure(s) Performed: Procedure(s): SHOULDER ARTHROSCOPY DEBRIDEMENT DECOMPRESSION WITH ROTATOR CUFF REPAIR (Left)  Patient Location: PACU  Anesthesia Type: Regional, General  Level of Consciousness: awake, alert  and patient cooperative  Airway and Oxygen Therapy: Patient Spontanous Breathing and Patient connected to supplemental oxygen  Post-op Assessment: Post-op Vital signs reviewed, Patient's Cardiovascular Status Stable, Respiratory Function Stable, Patent Airway and No signs of Nausea or vomiting  Post-op Vital Signs: Reviewed and stable  Complications: No apparent anesthesia complications

## 2015-03-20 NOTE — H&P (Signed)
Paper H&P to be scanned into permanent record. H&P reviewed. No changes. 

## 2015-03-20 NOTE — Discharge Instructions (Signed)
General Anesthesia, Care After °Refer to this sheet in the next few weeks. These instructions provide you with information on caring for yourself after your procedure. Your health care provider may also give you more specific instructions. Your treatment has been planned according to current medical practices, but problems sometimes occur. Call your health care provider if you have any problems or questions after your procedure. °WHAT TO EXPECT AFTER THE PROCEDURE °After the procedure, it is typical to experience: °· Sleepiness. °· Nausea and vomiting. °HOME CARE INSTRUCTIONS °· For the first 24 hours after general anesthesia: °¨ Have a responsible person with you. °¨ Do not drive a car. If you are alone, do not take public transportation. °¨ Do not drink alcohol. °¨ Do not take medicine that has not been prescribed by your health care provider. °¨ Do not sign important papers or make important decisions. °¨ You may resume a normal diet and activities as directed by your health care provider. °· Change bandages (dressings) as directed. °· If you have questions or problems that seem related to general anesthesia, call the hospital and ask for the anesthetist or anesthesiologist on call. °SEEK MEDICAL CARE IF: °· You have nausea and vomiting that continue the day after anesthesia. °· You develop a rash. °SEEK IMMEDIATE MEDICAL CARE IF:  °· You have difficulty breathing. °· You have chest pain. °· You have any allergic problems. °Document Released: 10/05/2000 Document Revised: 07/04/2013 Document Reviewed: 01/12/2013 °Exit[employer]® Patient Information ©2015 [employer]. This information is not intended to replace advice given to you by your health care provider. Make sure you discuss any questions you have with your health care provider. ° °Keep dressing dry and intact.  °May shower after dressing changed on post-op day #4 (Sunday).  °Cover staples/sutures with Band-Aids after drying off. °Apply ice frequently to  shoulder. °Keep shoulder immobilizer on at all times except may remove for bathing purposes. °Follow-up in 10-14 days or as scheduled. °

## 2015-03-20 NOTE — Progress Notes (Signed)
Assisted Debabtrata Maji ANMD with left, ultrasound guided, interscalene  block. Side rails up, monitors on throughout procedure. See vital signs in flow sheet. Tolerated Procedure well.  

## 2015-03-20 NOTE — Anesthesia Procedure Notes (Addendum)
Anesthesia Regional Block:  Interscalene brachial plexus block  Pre-Anesthetic Checklist: ,, timeout performed, Correct Patient, Correct Site, Correct Laterality, Correct Procedure, Correct Position, site marked, Risks and benefits discussed,  Surgical consent,  Pre-op evaluation,  At surgeon's request and post-op pain management  Laterality: Left  Prep: chloraprep       Needles:  Injection technique: Single-shot  Needle Type: Stimiplex     Needle Length: 10cm 10 cm Needle Gauge: 21 and 21 G    Additional Needles:  Procedures: ultrasound guided (picture in chart) Interscalene brachial plexus block  Nerve Stimulator or Paresthesia:  Response: shoulder twitch, 0.5 mA,   Additional Responses:   Narrative:  Start time: 03/20/2015 1:26 PM End time: 03/20/2015 1:34 PM Injection made incrementally with aspirations every 5 mL.  Performed by: Personally  Anesthesiologist: Orrin Brigham  Additional Notes: Functioning IV was confirmed and monitors applied. Ultrasound guidance: relevant anatomy identified, needle position confirmed, local anesthetic spread visualized around nerve(s)., vascular puncture avoided.  Image printed for medical record.  Negative aspiration and no paresthesias; incremental administration of local anesthetic. The patient tolerated the procedure well. Vitals signes recorded in RN notes.   Procedure Name: LMA Insertion Date/Time: 03/20/2015 1:44 PM Performed by: Andee Poles Pre-anesthesia Checklist: Patient identified, Emergency Drugs available, Suction available, Timeout performed and Patient being monitored Patient Re-evaluated:Patient Re-evaluated prior to inductionOxygen Delivery Method: Circle system utilized Preoxygenation: Pre-oxygenation with 100% oxygen Intubation Type: IV induction LMA: LMA inserted LMA Size: 4.0 Number of attempts: 1 Placement Confirmation: positive ETCO2 and breath sounds checked- equal and bilateral Tube secured with:  Tape

## 2015-03-20 NOTE — Anesthesia Postprocedure Evaluation (Signed)
  Anesthesia Post-op Note  Patient: Jocelyn Palmer  Procedure(s) Performed: Procedure(s): SHOULDER ARTHROSCOPY DEBRIDEMENT DECOMPRESSION WITH ROTATOR CUFF REPAIR (Left)  Anesthesia type:Regional, General  Patient location: PACU  Post pain: Pain level controlled  Post assessment: Post-op Vital signs reviewed, Patient's Cardiovascular Status Stable, Respiratory Function Stable, Patent Airway and No signs of Nausea or vomiting  Post vital signs: Reviewed and stable  Last Vitals:  Filed Vitals:   03/20/15 1600  BP: 142/94  Pulse: 84  Temp:   Resp: 19    Level of consciousness: awake, alert  and patient cooperative  Complications: No apparent anesthesia complications
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
## 2015-03-21 ENCOUNTER — Encounter: Payer: Self-pay | Admitting: Surgery

## 2015-07-08 LAB — HM PAP SMEAR: HM PAP: NORMAL

## 2015-12-20 ENCOUNTER — Other Ambulatory Visit: Payer: Self-pay | Admitting: Surgery

## 2015-12-20 DIAGNOSIS — M7582 Other shoulder lesions, left shoulder: Secondary | ICD-10-CM

## 2015-12-20 DIAGNOSIS — M7502 Adhesive capsulitis of left shoulder: Secondary | ICD-10-CM

## 2015-12-20 DIAGNOSIS — S46012A Strain of muscle(s) and tendon(s) of the rotator cuff of left shoulder, initial encounter: Secondary | ICD-10-CM

## 2015-12-25 ENCOUNTER — Ambulatory Visit
Admission: RE | Admit: 2015-12-25 | Discharge: 2015-12-25 | Disposition: A | Discharge status: Home / Self Care | Payer: 59 | Source: Ambulatory Visit | Attending: Surgery | Admitting: Surgery

## 2015-12-25 DIAGNOSIS — M7989 Other specified soft tissue disorders: Secondary | ICD-10-CM | POA: Diagnosis not present

## 2015-12-25 DIAGNOSIS — S46012A Strain of muscle(s) and tendon(s) of the rotator cuff of left shoulder, initial encounter: Secondary | ICD-10-CM

## 2015-12-25 DIAGNOSIS — Z9889 Other specified postprocedural states: Secondary | ICD-10-CM | POA: Diagnosis not present

## 2015-12-25 DIAGNOSIS — M7502 Adhesive capsulitis of left shoulder: Secondary | ICD-10-CM

## 2015-12-25 DIAGNOSIS — M25512 Pain in left shoulder: Secondary | ICD-10-CM | POA: Diagnosis not present

## 2015-12-25 DIAGNOSIS — M7582 Other shoulder lesions, left shoulder: Secondary | ICD-10-CM

## 2016-09-28 ENCOUNTER — Other Ambulatory Visit (INDEPENDENT_AMBULATORY_CARE_PROVIDER_SITE_OTHER): Payer: Self-pay | Admitting: Vascular Surgery

## 2016-11-13 ENCOUNTER — Other Ambulatory Visit: Payer: Self-pay | Admitting: Internal Medicine

## 2016-11-13 DIAGNOSIS — Z1231 Encounter for screening mammogram for malignant neoplasm of breast: Secondary | ICD-10-CM

## 2016-12-09 ENCOUNTER — Ambulatory Visit: Payer: 59 | Attending: Internal Medicine

## 2017-02-05 ENCOUNTER — Other Ambulatory Visit: Payer: Self-pay | Admitting: Internal Medicine

## 2017-02-05 DIAGNOSIS — M545 Low back pain: Secondary | ICD-10-CM

## 2017-02-11 ENCOUNTER — Ambulatory Visit
Admission: RE | Admit: 2017-02-11 | Discharge: 2017-02-11 | Disposition: A | Discharge status: Home / Self Care | Payer: 59 | Source: Ambulatory Visit | Attending: Internal Medicine | Admitting: Internal Medicine

## 2017-02-11 DIAGNOSIS — M2578 Osteophyte, vertebrae: Secondary | ICD-10-CM | POA: Diagnosis not present

## 2017-02-11 DIAGNOSIS — M545 Low back pain: Secondary | ICD-10-CM

## 2017-02-11 DIAGNOSIS — M5126 Other intervertebral disc displacement, lumbar region: Secondary | ICD-10-CM | POA: Diagnosis not present

## 2017-02-11 DIAGNOSIS — M5127 Other intervertebral disc displacement, lumbosacral region: Secondary | ICD-10-CM | POA: Insufficient documentation

## 2017-02-11 DIAGNOSIS — M5136 Other intervertebral disc degeneration, lumbar region: Secondary | ICD-10-CM | POA: Diagnosis not present

## 2017-02-23 ENCOUNTER — Ambulatory Visit
Admission: RE | Admit: 2017-02-23 | Discharge: 2017-02-23 | Disposition: A | Discharge status: Home / Self Care | Payer: 59 | Source: Ambulatory Visit | Attending: Internal Medicine | Admitting: Internal Medicine

## 2017-02-23 DIAGNOSIS — Z1231 Encounter for screening mammogram for malignant neoplasm of breast: Secondary | ICD-10-CM | POA: Diagnosis present

## 2017-03-02 ENCOUNTER — Ambulatory Visit (INDEPENDENT_AMBULATORY_CARE_PROVIDER_SITE_OTHER): Payer: 59 | Admitting: Vascular Surgery

## 2017-03-03 ENCOUNTER — Other Ambulatory Visit: Payer: Self-pay | Admitting: *Deleted

## 2017-03-03 ENCOUNTER — Inpatient Hospital Stay
Admission: RE | Admit: 2017-03-03 | Discharge: 2017-03-03 | Disposition: A | Discharge status: Home / Self Care | Payer: Self-pay | Source: Ambulatory Visit | Attending: *Deleted | Admitting: *Deleted

## 2017-03-03 DIAGNOSIS — Z9289 Personal history of other medical treatment: Secondary | ICD-10-CM

## 2017-03-25 ENCOUNTER — Other Ambulatory Visit (INDEPENDENT_AMBULATORY_CARE_PROVIDER_SITE_OTHER): Payer: Self-pay | Admitting: Vascular Surgery

## 2017-04-30 ENCOUNTER — Telehealth: Payer: Self-pay | Admitting: Obstetrics and Gynecology

## 2017-04-30 NOTE — Telephone Encounter (Signed)
Pt is coming on 11/2 for mirena removal and insert. She states she was given Valium and hospital dosage of ibuerofen to help with the pain and to calm her nerves during the process. She had two questions, 1- can that be sent in again? And 2- pt is taking xarelto and was wondering if it was ok to mix those medicines with the xarelto? Please call with info. Thank you.

## 2017-04-30 NOTE — Telephone Encounter (Signed)
Pt coming 11/2 at 3:30 for mirena removal and insert with SDJ

## 2017-05-04 MED ORDER — IBUPROFEN 800 MG PO TABS: 800.0000 mg | ORAL_TABLET | Freq: Three times a day (TID) | ORAL | 0 refills | Status: DC | PRN

## 2017-05-04 NOTE — Telephone Encounter (Signed)
Please let me know when pt callsback ?

## 2017-05-11 NOTE — Telephone Encounter (Signed)
Mirena stock reserved for this patient. 

## 2017-05-11 NOTE — Telephone Encounter (Signed)
Please let me know if pt returns our call

## 2017-05-14 ENCOUNTER — Ambulatory Visit (INDEPENDENT_AMBULATORY_CARE_PROVIDER_SITE_OTHER): Payer: 59 | Admitting: Obstetrics and Gynecology

## 2017-05-14 ENCOUNTER — Encounter: Payer: Self-pay | Admitting: Obstetrics and Gynecology

## 2017-05-14 ENCOUNTER — Telehealth: Payer: Self-pay | Admitting: Obstetrics and Gynecology

## 2017-05-14 VITALS — BP 128/88 | Ht 65.0 in | Wt 190.0 lb

## 2017-05-14 DIAGNOSIS — Z30432 Encounter for removal of intrauterine contraceptive device: Secondary | ICD-10-CM

## 2017-05-14 DIAGNOSIS — Z30430 Encounter for insertion of intrauterine contraceptive device: Secondary | ICD-10-CM

## 2017-05-14 MED ORDER — DIAZEPAM 5 MG PO TABS: 5.0000 mg | ORAL_TABLET | Freq: Once | ORAL | 0 refills | Status: AC

## 2017-05-14 NOTE — Telephone Encounter (Signed)
Patient coming in 11/20 for Mirena insertion

## 2017-05-16 ENCOUNTER — Encounter: Payer: Self-pay | Admitting: Obstetrics and Gynecology

## 2017-05-16 NOTE — Progress Notes (Signed)
    IUD Removal  Patient identified, informed consent performed, consent signed.  Patient was in the dorsal lithotomy position, normal external genitalia was noted.  A speculum was placed in the patient's vagina, normal discharge was noted, no lesions. The cervix was visualized, no lesions, no abnormal discharge.  The strings of the IUD were grasped and pulled using ring forceps. The IUD was removed in its entirety. Patient tolerated the procedure well.    IUD Insertion Procedure Note Patient identified, informed consent performed, consent signed.   Discussed risks of irregular bleeding, cramping, infection, malpositioning, expulsion or uterine perforation of the IUD (1:1000 placements)  which may require further procedure such as laparoscopy.  IUD while effective at preventing pregnancy do not prevent transmission of sexually transmitted diseases and use of barrier methods for this purpose was discussed. Time out was performed.  Urine pregnancy test not done as she just had IUD removed.   Speculum had already been placed in the vagina.  Cervix visualized.  Cleaned with Betadine x 2.  Grasped anteriorly with a single tooth tenaculum.  Cervix stenotic and would not allow IUD inserter to pass.  Gentle dilation with Shawnie PonsPratt dilators undertaken.  Patient was able to tolerate this portion of the procedure. However, I still could not pass the IUD inserter through the internal os.  Patient asked to terminate the procedure due to extreme discomfort.  I agreed to terminate the procedure immediately at the patient's request.  Tenaculum was removed, good hemostasis noted.    Patient will use another IUD for contraception.  Patient and I discussed use of the IUD.  She would like to try again. However, given her level of discomfort, will try using a paracervical block next time.  She was given a prescription for valium 5mg  to take just before the procedure, as her anxiety is a large component of her intolerance of the  procedure, as well.  Patient instructed to use back-up contraception until her IUD can be placed.    Thomasene MohairStephen Yolanda Huffstetler, MD 05/16/2017 1:16 PM

## 2017-06-01 ENCOUNTER — Ambulatory Visit: Payer: 59 | Admitting: Obstetrics and Gynecology

## 2017-08-25 NOTE — Telephone Encounter (Signed)
Per Epic/apt cancelled

## 2017-09-13 ENCOUNTER — Other Ambulatory Visit (INDEPENDENT_AMBULATORY_CARE_PROVIDER_SITE_OTHER): Payer: Self-pay | Admitting: Vascular Surgery

## 2017-09-13 ENCOUNTER — Ambulatory Visit: Payer: 59 | Admitting: Obstetrics and Gynecology

## 2017-09-17 ENCOUNTER — Encounter: Payer: Self-pay | Admitting: Obstetrics and Gynecology

## 2017-09-17 ENCOUNTER — Ambulatory Visit (INDEPENDENT_AMBULATORY_CARE_PROVIDER_SITE_OTHER): Payer: 59 | Admitting: Obstetrics and Gynecology

## 2017-09-17 VITALS — BP 124/78 | Ht 65.0 in | Wt 186.0 lb

## 2017-09-17 DIAGNOSIS — Z1331 Encounter for screening for depression: Secondary | ICD-10-CM | POA: Diagnosis not present

## 2017-09-17 DIAGNOSIS — Z01419 Encounter for gynecological examination (general) (routine) without abnormal findings: Secondary | ICD-10-CM | POA: Diagnosis not present

## 2017-09-17 DIAGNOSIS — Z1339 Encounter for screening examination for other mental health and behavioral disorders: Secondary | ICD-10-CM

## 2017-09-17 DIAGNOSIS — Z124 Encounter for screening for malignant neoplasm of cervix: Secondary | ICD-10-CM | POA: Diagnosis not present

## 2017-09-17 LAB — HM PAP SMEAR: HM Pap smear: NORMAL
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
## 2017-09-21 LAB — IGP, APTIMA HPV, RFX 16/18,45
HPV APTIMA: NEGATIVE
PAP SMEAR COMMENT: 0

## 2017-09-29 ENCOUNTER — Encounter: Payer: Self-pay | Admitting: Obstetrics and Gynecology

## 2018-01-06 ENCOUNTER — Other Ambulatory Visit: Payer: Self-pay | Admitting: Internal Medicine

## 2018-01-06 DIAGNOSIS — Z1231 Encounter for screening mammogram for malignant neoplasm of breast: Secondary | ICD-10-CM

## 2018-02-24 ENCOUNTER — Ambulatory Visit
Admission: RE | Admit: 2018-02-24 | Discharge: 2018-02-24 | Disposition: A | Discharge status: Home / Self Care | Payer: 59 | Source: Ambulatory Visit | Attending: Internal Medicine | Admitting: Internal Medicine

## 2018-02-24 DIAGNOSIS — Z1231 Encounter for screening mammogram for malignant neoplasm of breast: Secondary | ICD-10-CM | POA: Diagnosis not present

## 2018-03-22 ENCOUNTER — Other Ambulatory Visit (INDEPENDENT_AMBULATORY_CARE_PROVIDER_SITE_OTHER): Payer: Self-pay | Admitting: Vascular Surgery

## 2018-04-22 ENCOUNTER — Other Ambulatory Visit: Payer: Self-pay | Admitting: Psychiatry

## 2018-05-13 ENCOUNTER — Encounter: Payer: Self-pay | Admitting: Emergency Medicine

## 2018-05-13 DIAGNOSIS — F411 Generalized anxiety disorder: Secondary | ICD-10-CM | POA: Insufficient documentation

## 2018-05-23 ENCOUNTER — Other Ambulatory Visit: Payer: Self-pay | Admitting: Internal Medicine

## 2018-05-23 DIAGNOSIS — R7989 Other specified abnormal findings of blood chemistry: Secondary | ICD-10-CM

## 2018-05-26 ENCOUNTER — Ambulatory Visit
Admission: RE | Admit: 2018-05-26 | Discharge: 2018-05-26 | Disposition: A | Discharge status: Home / Self Care | Payer: 59 | Source: Ambulatory Visit | Attending: Internal Medicine | Admitting: Internal Medicine

## 2018-05-26 DIAGNOSIS — K7689 Other specified diseases of liver: Secondary | ICD-10-CM | POA: Insufficient documentation

## 2018-05-26 DIAGNOSIS — R7989 Other specified abnormal findings of blood chemistry: Secondary | ICD-10-CM | POA: Insufficient documentation

## 2018-05-30 ENCOUNTER — Encounter: Payer: Self-pay | Admitting: Psychiatry

## 2018-05-30 ENCOUNTER — Ambulatory Visit (INDEPENDENT_AMBULATORY_CARE_PROVIDER_SITE_OTHER): Payer: 59 | Admitting: Psychiatry

## 2018-05-30 DIAGNOSIS — F411 Generalized anxiety disorder: Secondary | ICD-10-CM | POA: Diagnosis not present

## 2018-05-30 DIAGNOSIS — R7989 Other specified abnormal findings of blood chemistry: Secondary | ICD-10-CM | POA: Diagnosis not present

## 2018-05-30 DIAGNOSIS — F3281 Premenstrual dysphoric disorder: Secondary | ICD-10-CM | POA: Diagnosis not present

## 2018-05-30 DIAGNOSIS — F331 Major depressive disorder, recurrent, moderate: Secondary | ICD-10-CM

## 2018-05-30 MED ORDER — QUETIAPINE FUMARATE 300 MG PO TABS: 300.0000 mg | ORAL_TABLET | Freq: Every day | ORAL | 5 refills | Status: DC
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
## 2018-05-31 ENCOUNTER — Other Ambulatory Visit: Payer: Self-pay | Admitting: Family

## 2018-05-31 DIAGNOSIS — K7689 Other specified diseases of liver: Secondary | ICD-10-CM

## 2018-06-17 ENCOUNTER — Ambulatory Visit
Admission: RE | Admit: 2018-06-17 | Discharge: 2018-06-17 | Disposition: A | Discharge status: Home / Self Care | Payer: 59 | Source: Ambulatory Visit | Attending: Family | Admitting: Family

## 2018-06-17 DIAGNOSIS — K7689 Other specified diseases of liver: Secondary | ICD-10-CM | POA: Insufficient documentation

## 2018-06-17 DIAGNOSIS — I700 Atherosclerosis of aorta: Secondary | ICD-10-CM | POA: Insufficient documentation

## 2018-06-17 LAB — POCT I-STAT CREATININE: Creatinine, Ser: 0.9 mg/dL (ref 0.44–1.00)

## 2018-06-17 MED ORDER — GADOBUTROL 1 MMOL/ML IV SOLN
8.0000 mL | Freq: Once | INTRAVENOUS | Status: AC | PRN
  Administered 2018-06-17: 8 mL via INTRAVENOUS

## 2018-06-30 ENCOUNTER — Other Ambulatory Visit: Payer: Self-pay

## 2018-06-30 MED ORDER — FLUOXETINE HCL 10 MG PO CAPS: 10.0000 mg | ORAL_CAPSULE | Freq: Every day | ORAL | 3 refills | Status: DC

## 2018-08-23 ENCOUNTER — Encounter: Payer: Self-pay | Admitting: Sports Medicine

## 2018-08-23 ENCOUNTER — Ambulatory Visit: Payer: 59 | Admitting: Sports Medicine

## 2018-08-23 VITALS — BP 119/67 | HR 73 | Resp 16

## 2018-08-23 DIAGNOSIS — M79675 Pain in left toe(s): Secondary | ICD-10-CM | POA: Diagnosis not present

## 2018-08-23 DIAGNOSIS — L603 Nail dystrophy: Secondary | ICD-10-CM

## 2018-08-23 MED ORDER — NEOMYCIN-POLYMYXIN-HC 3.5-10000-1 OT SOLN: OTIC | 0 refills | Status: DC

## 2018-08-23 NOTE — Progress Notes (Signed)
Subjective: Jocelyn Palmer is a 48 y.o. female patient seen today in office with complaint of mildly painful left great toenail reports that she is not sure if she has an ingrown toenail but states that it is discolored and painful corner states that she spoke with Epson salt which seems to help however does report that she has a history of pedicures and during the wintertime she leaves her polish off because of the changes that it causes to her toenails and usually it grows out but is concerned because there is a ridge in herself toenail with pain.  Patient does do weight training but denies any trauma to the toes.  Patient is on Xarelto for history of multiple blood clots and also has factor V Leiden.  On gabapentin for nerve issues and low back.  Patient has no other pedal complaints at this time.   Review of Systems  Neurological: Positive for sensory change.  Psychiatric/Behavioral:       Anxious  All other systems reviewed and are negative.    Patient Active Problem List   Diagnosis Date Noted  . GAD (generalized anxiety disorder) 05/13/2018    Current Outpatient Medications on File Prior to Visit  Medication Sig Dispense Refill  . baclofen (LIORESAL) 10 MG tablet Take by mouth.    . fluticasone (FLONASE) 50 MCG/ACT nasal spray Place into both nostrils daily.    Marland Kitchen. gabapentin (NEURONTIN) 100 MG capsule Take 200 mg by mouth at bedtime.    Marland Kitchen. loratadine (CLARITIN) 10 MG tablet Take 10 mg by mouth daily.    Marland Kitchen. omeprazole (PRILOSEC) 40 MG capsule     . QUEtiapine (SEROQUEL) 300 MG tablet Take 1 tablet (300 mg total) by mouth at bedtime. 30 tablet 5  . Vitamin D, Ergocalciferol, (DRISDOL) 1.25 MG (50000 UT) CAPS capsule Take 50,000 Units by mouth every 7 (seven) days.    Carlena Hurl. XARELTO 20 MG TABS tablet TAKE 1 TABLET BY MOUTH EVERY DAY 30 tablet 5   No current facility-administered medications on file prior to visit.     Allergies  Allergen Reactions  . Sulfa Antibiotics Other (See  Comments)    Eye drops - blisters on eyes    Objective: Physical Exam  General: Well developed, nourished, no acute distress however does admit that she is a little anxious, awake, alert and oriented x 3  Vascular: Dorsalis pedis artery 2/4 bilateral, Posterior tibial artery 2/4 bilateral, skin temperature warm to warm proximal to distal bilateral lower extremities, no varicosities, pedal hair present bilateral.  Neurological: Gross sensation present via light touch bilateral.   Dermatological: Skin is warm, dry, and supple bilateral, left hallux nail has a transverse trauma line with lifting of distal nail which is likely creating pressure at the medial corner however there is no warmth redness swelling or acute signs of infection to her left great toenail, evidence of extended/excessive wear of nail polish, no open lesions present bilateral, no callus/corns/hyperkeratotic tissue present bilateral. No signs of infection bilateral.  Musculoskeletal: No symptomatic boney deformities noted bilateral. Muscular strength within normal limits without painon range of motion. No pain with calf compression bilateral.  Assessment and Plan:  Problem List Items Addressed This Visit    None    Visit Diagnoses    Nail dystrophy    -  Primary   Toe pain, left          -Examined patient.  -Discussed treatment options for dystrophic left great toenail -Mechanically debrided and reduced left  great toenail with sterile nail nipper and dremel nail file without incident. -Prescribed Corticosporin solution to use and advised Epson salt soaks for 1 more week -Advised patient to allow rest from nail polish and to avoid strenuous activities that cause pressure to her toes especially her left great toe -Patient to return as needed or sooner if symptoms worsen.  Advised patient if her nail problem recurs may benefit from a nail procedure after appropriate medical clearances since patient is on Xarelto with  factor V Leiden history.  [name], DPM

## 2018-09-03 ENCOUNTER — Other Ambulatory Visit: Payer: Self-pay | Admitting: Psychiatry

## 2018-10-06 ENCOUNTER — Other Ambulatory Visit (INDEPENDENT_AMBULATORY_CARE_PROVIDER_SITE_OTHER): Payer: Self-pay | Admitting: Vascular Surgery

## 2018-10-26 ENCOUNTER — Other Ambulatory Visit: Payer: Self-pay | Admitting: Psychiatry

## 2018-11-06 IMAGING — MR MR LUMBAR SPINE W/O CM
5 series · 38 of 48 positions shown · non-contrast
Comparison: MRI 04/11/2007

CLINICAL DATA: Low back pain

EXAM:
MRI LUMBAR SPINE WITHOUT CONTRAST
TECHNIQUE: Multiplanar, multisequence MR imaging of the lumbar spine was
performed. No intravenous contrast was administered.

[Series 2: T2 · sagittal · 4.0mm · 0.81mm/px · 6 of 15 slices shown (1 of 2)]
[im 1/15]
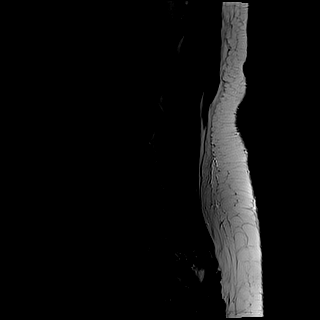
[im 3/15]
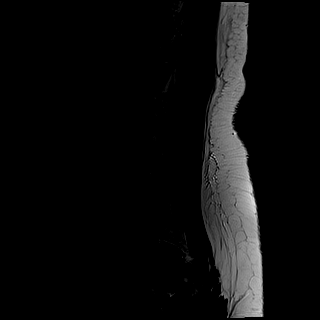
[im 6/15]
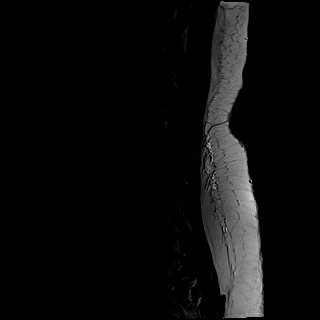
[im 9/15]
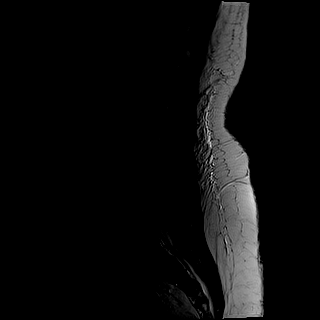
[im 12/15]
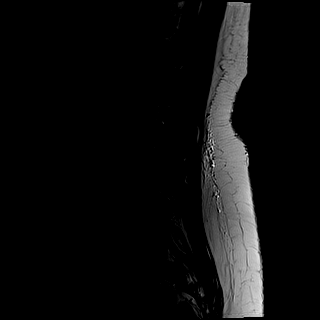
[im 15/15]
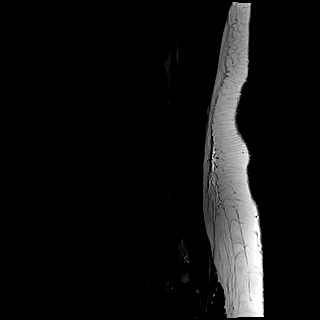

[Series 3: T1 · sagittal · 4.0mm · 0.81mm/px · 6 of 15 slices shown (1 of 2)]
[im 1/15]
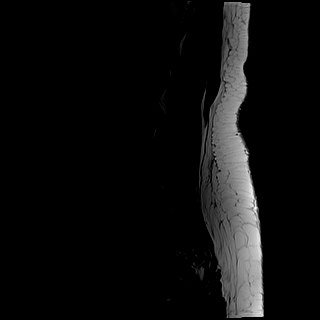
[im 3/15]
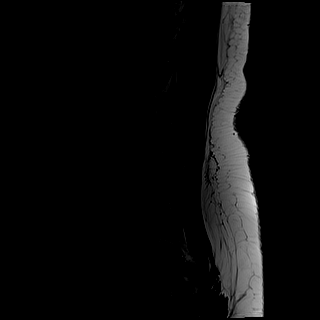
[im 6/15]
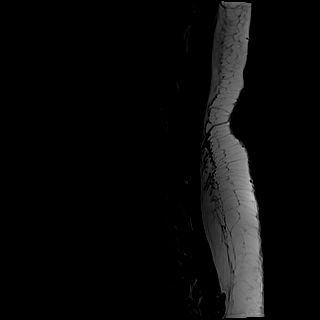
[im 9/15]
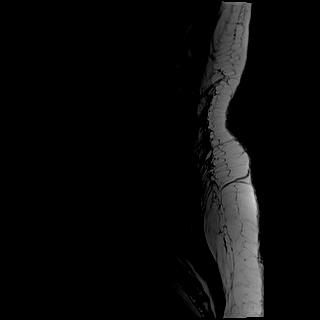
[im 12/15]
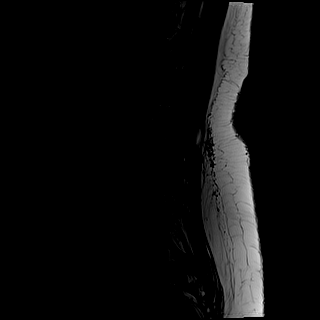
[im 15/15]
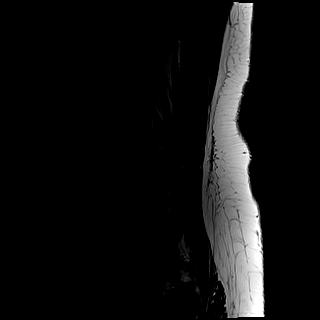

[Series 4: STIR · sagittal · 4.0mm · 1.02mm/px · 6 of 15 slices shown]
[im 1/15]
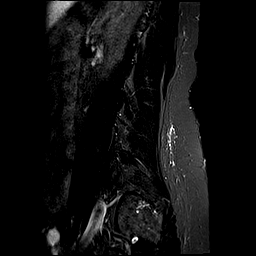
[im 3/15]
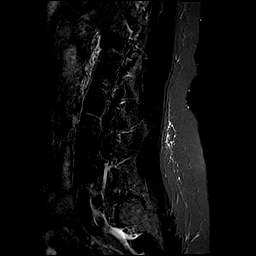
[im 6/15]
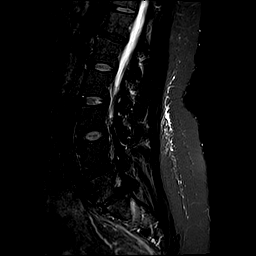
[im 9/15]
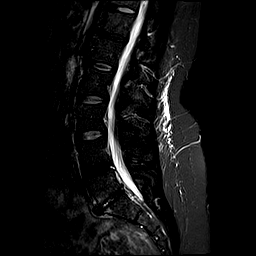
[im 12/15]
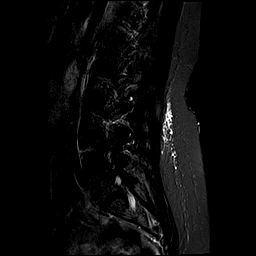
[im 15/15]
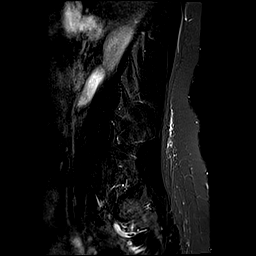

[Series 5: T2 · axial · 4.0mm · 0.78mm/px · z∈[-133,+74]mm · 11 of 38 slices shown (2 of 2)]
[im 1/38]
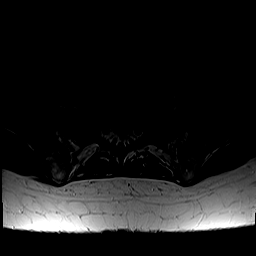
[im 3/38]
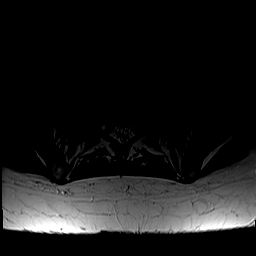
[im 6/38]
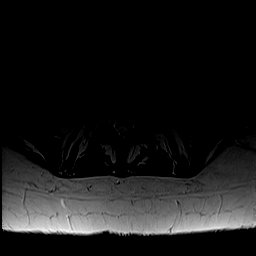
[im 8/38]
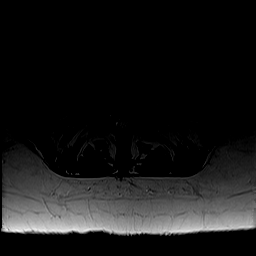
[im 11/38]
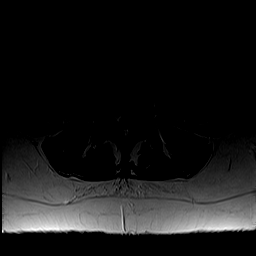
[im 16/38]
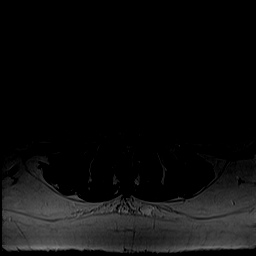
[im 19/38]
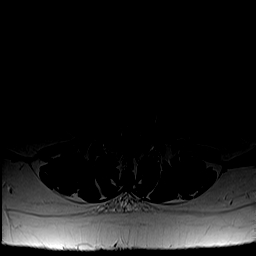
[im 22/38]
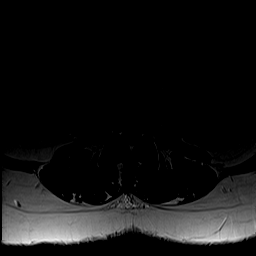
[im 27/38]
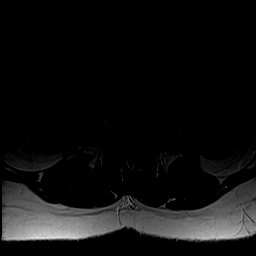
[im 32/38]
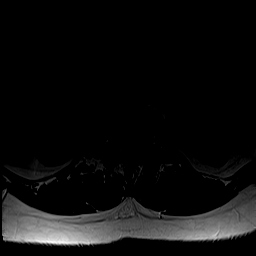
[im 38/38]
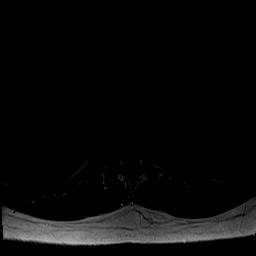

[Series 6: T1 · axial · 4.0mm · 0.39mm/px · z∈[-133,+74]mm · 9 of 38 slices shown (2 of 2)]
[im 1/38]
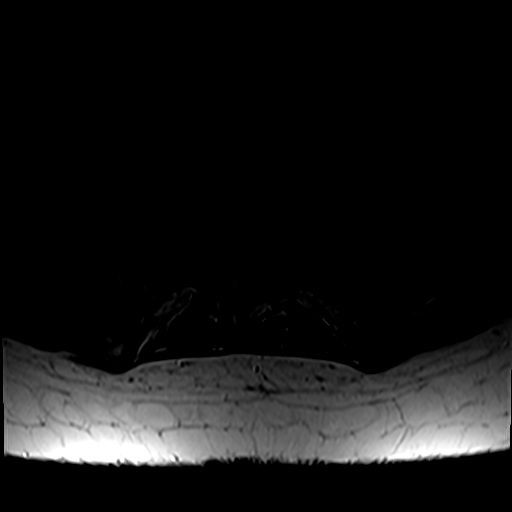
[im 6/38]
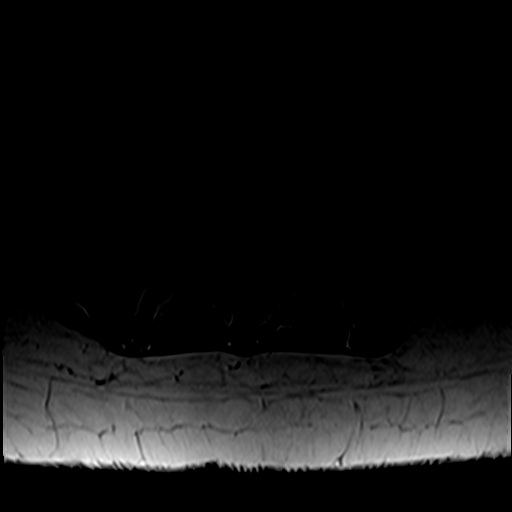
[im 11/38]
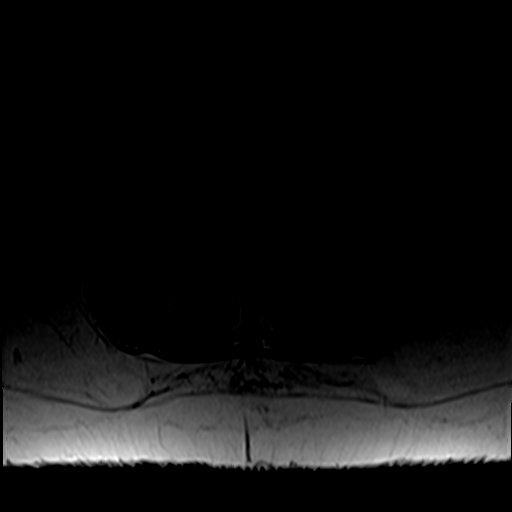
[im 16/38]
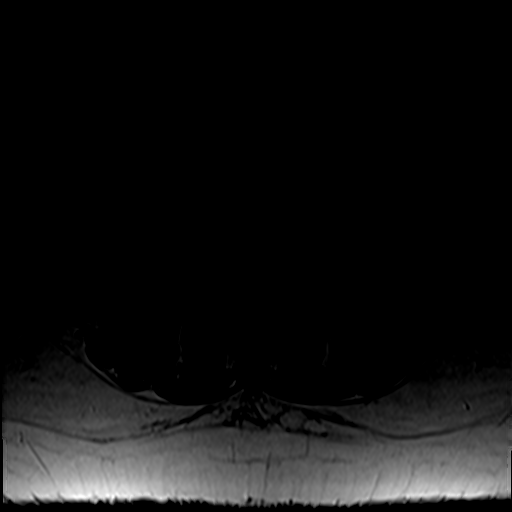
[im 19/38]
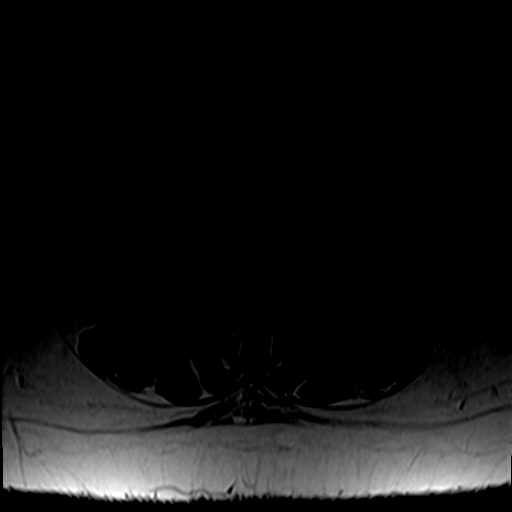
[im 22/38]
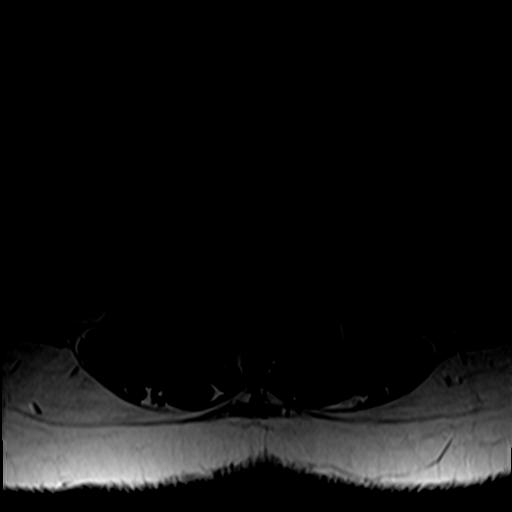
[im 27/38]
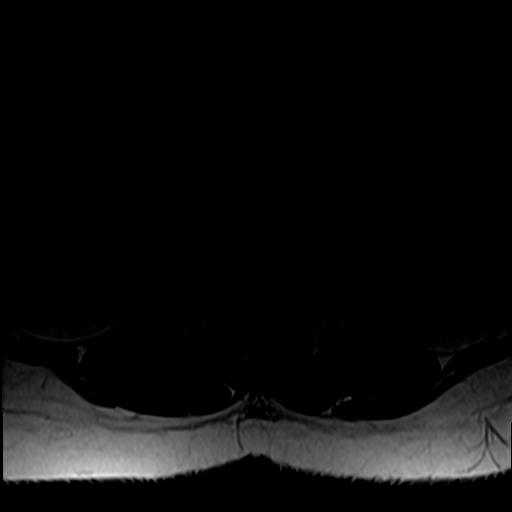
[im 32/38]
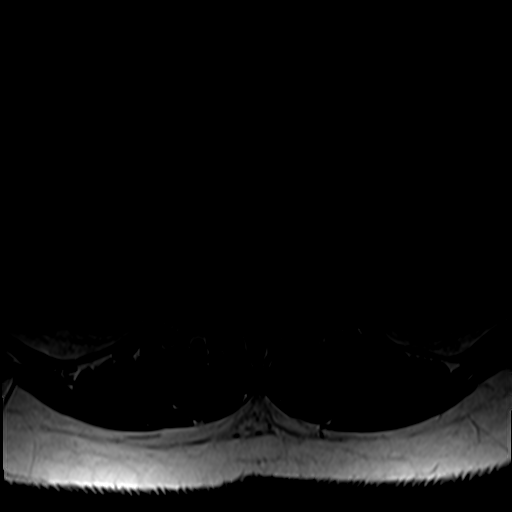
[im 38/38]
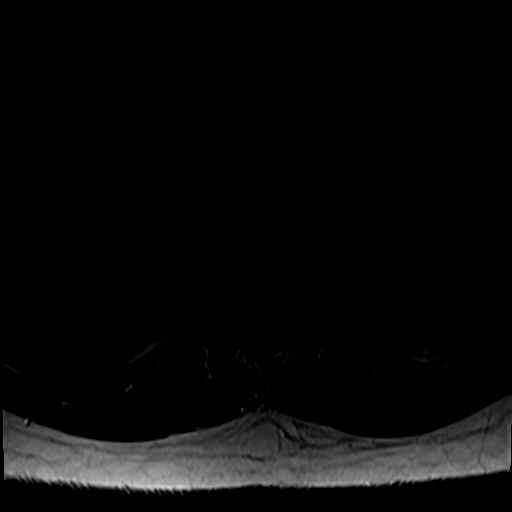

[38 of 48 positions shown; findings below may reference images not displayed]

FINDINGS: Segmentation:  Normal

Alignment:  Normal

Vertebrae: Negative for fracture or mass. Normal bone marrow.
Hemangioma L2 vertebral body

Conus medullaris: Extends to the L1-2 level and appears normal.

Paraspinal and other soft tissues: Negative

Disc levels:

L1-2:  Normal disc space.  Mild facet degeneration

L2-3:  Normal disc space.  Mild facet degeneration.

L3-4: Normal disc space. Mild facet degeneration. No significant
stenosis

L4-5: Mild disc and mild facet degeneration. Central annular fissure
without significant disc protrusion or spinal stenosis

L5-S1: Progressive disc degeneration with disc space narrowing since
prior study. Left foraminal and extraforaminal disc protrusion and
osteophyte has developed since the prior study. This could affect
the left L5 nerve root lateral to the foramen.
IMPRESSION: Progression of disc degeneration L5-S1. Interval development of left
foraminal and extraforaminal disc protrusion and osteophyte at L5-S1
which could affect the left L5 nerve root.

## 2018-11-28 ENCOUNTER — Ambulatory Visit: Payer: 59 | Admitting: Psychiatry

## 2018-12-15 ENCOUNTER — Encounter: Payer: Self-pay | Admitting: Psychiatry

## 2018-12-15 ENCOUNTER — Ambulatory Visit: Payer: 59 | Admitting: Psychiatry

## 2018-12-15 ENCOUNTER — Other Ambulatory Visit: Payer: Self-pay

## 2018-12-15 DIAGNOSIS — F411 Generalized anxiety disorder: Secondary | ICD-10-CM | POA: Diagnosis not present

## 2018-12-15 DIAGNOSIS — F3281 Premenstrual dysphoric disorder: Secondary | ICD-10-CM

## 2018-12-15 DIAGNOSIS — F515 Nightmare disorder: Secondary | ICD-10-CM

## 2018-12-15 DIAGNOSIS — F5105 Insomnia due to other mental disorder: Secondary | ICD-10-CM | POA: Diagnosis not present

## 2018-12-15 DIAGNOSIS — F3341 Major depressive disorder, recurrent, in partial remission: Secondary | ICD-10-CM

## 2018-12-15 DIAGNOSIS — R7989 Other specified abnormal findings of blood chemistry: Secondary | ICD-10-CM | POA: Diagnosis not present

## 2018-12-15 MED ORDER — PRAZOSIN HCL 1 MG PO CAPS: ORAL_CAPSULE | ORAL | 1 refills | Status: DC
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
## 2019-01-08 ENCOUNTER — Other Ambulatory Visit: Payer: Self-pay | Admitting: Psychiatry

## 2019-01-31 ENCOUNTER — Other Ambulatory Visit: Payer: Self-pay | Admitting: Psychiatry

## 2019-02-06 ENCOUNTER — Other Ambulatory Visit: Payer: Self-pay | Admitting: Family

## 2019-02-06 DIAGNOSIS — Z1231 Encounter for screening mammogram for malignant neoplasm of breast: Secondary | ICD-10-CM

## 2019-02-16 ENCOUNTER — Encounter: Payer: Self-pay | Admitting: Psychiatry

## 2019-02-16 ENCOUNTER — Ambulatory Visit (INDEPENDENT_AMBULATORY_CARE_PROVIDER_SITE_OTHER): Payer: 59 | Admitting: Psychiatry

## 2019-02-16 ENCOUNTER — Other Ambulatory Visit: Payer: Self-pay

## 2019-02-16 DIAGNOSIS — F3341 Major depressive disorder, recurrent, in partial remission: Secondary | ICD-10-CM

## 2019-02-16 DIAGNOSIS — F3281 Premenstrual dysphoric disorder: Secondary | ICD-10-CM | POA: Diagnosis not present

## 2019-02-16 DIAGNOSIS — F411 Generalized anxiety disorder: Secondary | ICD-10-CM

## 2019-02-16 DIAGNOSIS — R7989 Other specified abnormal findings of blood chemistry: Secondary | ICD-10-CM

## 2019-02-16 DIAGNOSIS — F515 Nightmare disorder: Secondary | ICD-10-CM | POA: Diagnosis not present

## 2019-02-16 DIAGNOSIS — F5105 Insomnia due to other mental disorder: Secondary | ICD-10-CM | POA: Diagnosis not present
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
## 2019-03-13 ENCOUNTER — Ambulatory Visit
Admission: RE | Admit: 2019-03-13 | Discharge: 2019-03-13 | Disposition: A | Discharge status: Home / Self Care | Payer: 59 | Source: Ambulatory Visit | Attending: Family | Admitting: Family

## 2019-03-13 DIAGNOSIS — Z1231 Encounter for screening mammogram for malignant neoplasm of breast: Secondary | ICD-10-CM | POA: Insufficient documentation

## 2019-03-23 ENCOUNTER — Other Ambulatory Visit: Payer: Self-pay | Admitting: Neurology

## 2019-03-23 DIAGNOSIS — G5131 Clonic hemifacial spasm, right: Secondary | ICD-10-CM

## 2019-03-30 ENCOUNTER — Other Ambulatory Visit: Payer: Self-pay | Admitting: Psychiatry

## 2019-03-31 ENCOUNTER — Ambulatory Visit: Payer: 59

## 2019-04-11 ENCOUNTER — Other Ambulatory Visit: Payer: Self-pay

## 2019-04-11 ENCOUNTER — Ambulatory Visit
Admission: RE | Admit: 2019-04-11 | Discharge: 2019-04-11 | Disposition: A | Discharge status: Home / Self Care | Payer: 59 | Source: Ambulatory Visit | Attending: Neurology | Admitting: Neurology

## 2019-04-11 DIAGNOSIS — G5131 Clonic hemifacial spasm, right: Secondary | ICD-10-CM | POA: Diagnosis present

## 2019-04-11 MED ORDER — GADOBUTROL 1 MMOL/ML IV SOLN
8.0000 mL | Freq: Once | INTRAVENOUS | Status: AC | PRN
  Administered 2019-04-11: 18:00:00 8 mL via INTRAVENOUS

## 2019-04-29 ENCOUNTER — Other Ambulatory Visit: Payer: Self-pay | Admitting: Psychiatry

## 2019-08-27 ENCOUNTER — Other Ambulatory Visit: Payer: Self-pay | Admitting: Psychiatry

## 2019-08-27 NOTE — Telephone Encounter (Signed)
Due back Nov, nothing scheduled

## 2019-11-13 ENCOUNTER — Ambulatory Visit: Payer: 59 | Admitting: Dermatology

## 2019-11-13 ENCOUNTER — Other Ambulatory Visit: Payer: Self-pay

## 2019-11-13 VITALS — BP 100/71

## 2019-11-13 DIAGNOSIS — L219 Seborrheic dermatitis, unspecified: Secondary | ICD-10-CM

## 2019-11-13 DIAGNOSIS — L659 Nonscarring hair loss, unspecified: Secondary | ICD-10-CM

## 2019-11-13 MED ORDER — KETOCONAZOLE 2 % EX SHAM: 1.0000 "application " | MEDICATED_SHAMPOO | CUTANEOUS | 3 refills | Status: DC

## 2019-11-13 MED ORDER — FLUOCINOLONE ACETONIDE 0.01 % EX SOLN: Freq: Two times a day (BID) | CUTANEOUS | 2 refills | Status: DC

## 2019-11-13 NOTE — Progress Notes (Signed)
   New Patient Visit  Subjective  Jocelyn Palmer is a 49 y.o. female who presents for the following: Hair thinning (scalp, >61yrs, biotin qd, has a twin sister with hair thinning,).  Sister was born without a thyroid.  She is a fraternal twin.  They are adopted so additional family history not known.  She gets occasional itchy rash in hairline. Thinning is mainly on top of scalp.  It has been gradual thinning and is more noticeable now since she has dyed her hair dark.   The following portions of the chart were reviewed this encounter and updated as appropriate:      Review of Systems:  No other skin or systemic complaints except as noted in HPI or Assessment and Plan.  Objective  Well appearing patient in no apparent distress; mood and affect are within normal limits.  A focused examination was performed including face, scalp. Relevant physical exam findings are noted in the Assessment and Plan.  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  Pending labs will send into [facility] Spironolactone can cause increased urination and cause blood pressure to decrease. Please watch for signs of lightheadedness and be cautious when changing position. It can sometimes cause breast tenderness or an irregular period in premenopausal women. It can also increase potassium. The increase in potassium usually is not a concern unless you are taking other medicines that also increase potassium, so please be sure your doctor knows all of the other medications you are taking. This medication should not be taken  by  pregnant women.    Other Related Procedures CBC Thyroid Panel With TSH Ferritin  Seborrheic dermatitis occipital scalp, temporal hairline bil  Start fluocinolone solution qd/bid aa scalp Start Ketoconazole 2% shampoo 3x/wk, let sit 5 minutes before rinsing out.  fluocinolone (SYNALAR) 0.01 % external solution - occipital scalp, temporal hairline bil  ketoconazole (NIZORAL) 2 % shampoo - occipital scalp, temporal hairline bil  Return in about 3 months (around 02/13/2020) for Alopecia, seb derm.     Documentation: I have reviewed the above documentation for accuracy and completeness, and I agree with the above.  Willeen Niece, MD  I, Ardis Rowan, RMA, am acting as scribe for Willeen Niece, MD .

## 2019-11-14 ENCOUNTER — Telehealth: Payer: Self-pay

## 2019-11-14 LAB — CBC
Hematocrit: 44.4 % (ref 34.0–46.6)
Hemoglobin: 14.9 g/dL (ref 11.1–15.9)
MCH: 30.6 pg (ref 26.6–33.0)
MCHC: 33.6 g/dL (ref 31.5–35.7)
MCV: 91 fL (ref 79–97)
Platelets: 256 10*3/uL (ref 150–450)
RBC: 4.87 x10E6/uL (ref 3.77–5.28)
RDW: 12.1 % (ref 11.7–15.4)
WBC: 9.1 10*3/uL (ref 3.4–10.8)

## 2019-11-14 LAB — THYROID PANEL WITH TSH
Free Thyroxine Index: 2 (ref 1.2–4.9)
T3 Uptake Ratio: 25 % (ref 24–39)
T4, Total: 8.1 ug/dL (ref 4.5–12.0)
TSH: 1.78 u[IU]/mL (ref 0.450–4.500)

## 2019-11-14 LAB — FERRITIN: Ferritin: 78 ng/mL (ref 15–150)

## 2019-11-14 MED ORDER — SPIRONOLACTONE 100 MG PO TABS: 100.0000 mg | ORAL_TABLET | Freq: Every day | ORAL | 2 refills | Status: DC

## 2019-11-14 NOTE — Telephone Encounter (Signed)
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

## 2019-11-14 NOTE — Telephone Encounter (Signed)
Advised pt of labs and per note advised we would send in Spironolactone 100mg  1 po qd #30, 2rf./sh

## 2020-02-10 ENCOUNTER — Other Ambulatory Visit: Payer: Self-pay | Admitting: Dermatology

## 2020-02-27 ENCOUNTER — Ambulatory Visit: Payer: 59 | Admitting: Psychiatry

## 2020-03-05 ENCOUNTER — Ambulatory Visit: Payer: 59 | Admitting: Dermatology

## 2020-03-05 ENCOUNTER — Other Ambulatory Visit: Payer: Self-pay

## 2020-03-05 VITALS — BP 110/68

## 2020-03-05 DIAGNOSIS — L219 Seborrheic dermatitis, unspecified: Secondary | ICD-10-CM | POA: Diagnosis not present

## 2020-03-05 DIAGNOSIS — L659 Nonscarring hair loss, unspecified: Secondary | ICD-10-CM

## 2020-03-05 MED ORDER — SPIRONOLACTONE 100 MG PO TABS: 100.0000 mg | ORAL_TABLET | Freq: Every day | ORAL | 5 refills | Status: DC

## 2020-03-05 MED ORDER — HALOG 0.1 % EX SOLN: CUTANEOUS | 1 refills | Status: DC

## 2020-03-05 NOTE — Progress Notes (Signed)
   Follow-Up Visit   Subjective  Jocelyn Palmer is a 49 y.o. female who presents for the following: Alopecia (Improving. Patient using Rogaine 5% and Spironolactone 100mg  QD.) and Seb Derm (Improving. Patient using ketoconazole 2% shampoo. Fluocinolone solution was too expensive.).   The following portions of the chart were reviewed this encounter and updated as appropriate:      Review of Systems:  No other skin or systemic complaints except as noted in HPI or Assessment and Plan.  Objective  Well appearing patient in no apparent distress; mood and affect are within normal limits.  A focused examination was performed including scalp. Relevant physical exam findings are noted in the Assessment and Plan.  Objective  Scalp: Diffuse thinning of the crown and widening of the midline part with retention of the frontal hairline - Reviewed progressive nature and prognosis. Some improvement noted when compared to previous photos BP 110/68  Images    Objective  Scalp, Ears: Mild scale around ears; scalp clear today.   Assessment & Plan  Alopecia Scalp  Androgenetic Alopecia- improving Reviewed chronic condition  Continue Rogaine 5% qd/bid Continue Spironolactone 100mg  take 1 po QD.  Spironolactone can cause increased urination and cause blood pressure to decrease. Please watch for signs of lightheadedness and be cautious when changing position. It can sometimes cause breast tenderness or an irregular period in premenopausal women. It can also increase potassium. The increase in potassium usually is not a concern unless you are taking other medicines that also increase potassium, so please be sure your doctor knows all of the other medications you are taking. This medication should not be taken  by pregnant women.   Seborrheic dermatitis Scalp, Ears  Improving Continue Ketoconazole 2% Shampoo Apply to scalp, let sit 5 mins before rinsing.  Start Halog Solution Apply  to AAs scalp qd/bid prn itch, Avoid face. dsp 1Rf. ([facility])  Topical steroids (such as triamcinolone, fluocinolone, fluocinonide, mometasone, clobetasol, halobetasol, betamethasone, hydrocortisone) can cause thinning and lightening of the skin if they are used for too long in the same area. Your physician has selected the right strength medicine for your problem and area affected on the body. Please use your medication only as directed by your physician to prevent side effects.    halcinonide (HALOG) 0.1 % external solution - Scalp, Ears  Other Related Medications ketoconazole (NIZORAL) 2 % shampoo  Return in about 6 months (around 09/05/2020) for alopecia, Seb derm.   Documentation: I have reviewed the above documentation for accuracy and completeness, and I agree with the above.  MD

## 2020-03-05 NOTE — Patient Instructions (Addendum)
Your medications have been sent to [facility] and will be mailed to you after you call them and confirm your information with them.   [facility] [phone] [id number] [place], [place], [place] [address]    Topical steroids (such as triamcinolone, fluocinolone, fluocinonide, mometasone, clobetasol, halobetasol, betamethasone, hydrocortisone) can cause thinning and lightening of the skin if they are used for too long in the same area. Your physician has selected the right strength medicine for your problem and area affected on the body. Please use your medication only as directed by your physician to prevent side effects.    Spironolactone can cause increased urination and cause blood pressure to decrease. Please watch for signs of lightheadedness and be cautious when changing position. It can sometimes cause breast tenderness or an irregular period in premenopausal women. It can also increase potassium. The increase in potassium usually is not a concern unless you are taking other medicines that also increase potassium, so please be sure your doctor knows all of the other medications you are taking. This medication should not be taken  by pregnant women.

## 2020-03-11 ENCOUNTER — Other Ambulatory Visit: Payer: Self-pay | Admitting: Internal Medicine

## 2020-03-11 DIAGNOSIS — Z1231 Encounter for screening mammogram for malignant neoplasm of breast: Secondary | ICD-10-CM

## 2020-03-29 ENCOUNTER — Ambulatory Visit: Payer: 59 | Admitting: Psychiatry

## 2020-04-02 ENCOUNTER — Other Ambulatory Visit: Payer: Self-pay

## 2020-04-02 ENCOUNTER — Ambulatory Visit
Admission: RE | Admit: 2020-04-02 | Discharge: 2020-04-02 | Disposition: A | Discharge status: Home / Self Care | Payer: 59 | Source: Ambulatory Visit | Attending: Internal Medicine | Admitting: Internal Medicine

## 2020-04-02 DIAGNOSIS — Z1231 Encounter for screening mammogram for malignant neoplasm of breast: Secondary | ICD-10-CM | POA: Diagnosis not present

## 2020-08-13 ENCOUNTER — Ambulatory Visit (INDEPENDENT_AMBULATORY_CARE_PROVIDER_SITE_OTHER): Payer: 59 | Admitting: Obstetrics and Gynecology

## 2020-08-13 ENCOUNTER — Other Ambulatory Visit: Payer: Self-pay

## 2020-08-13 ENCOUNTER — Encounter: Payer: Self-pay | Admitting: Obstetrics and Gynecology

## 2020-08-13 VITALS — BP 126/78 | Ht 65.0 in | Wt 156.0 lb

## 2020-08-13 DIAGNOSIS — N951 Menopausal and female climacteric states: Secondary | ICD-10-CM | POA: Diagnosis not present
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
## 2020-08-14 LAB — FOLLICLE STIMULATING HORMONE: FSH: 67.2 m[IU]/mL

## 2020-08-14 LAB — ESTRADIOL: Estradiol: 99.5 pg/mL

## 2020-09-03 ENCOUNTER — Ambulatory Visit: Payer: 59 | Admitting: Dermatology

## 2020-09-03 ENCOUNTER — Other Ambulatory Visit: Payer: Self-pay

## 2020-09-03 VITALS — BP 112/73

## 2020-09-03 DIAGNOSIS — L219 Seborrheic dermatitis, unspecified: Secondary | ICD-10-CM

## 2020-09-03 DIAGNOSIS — L649 Androgenic alopecia, unspecified: Secondary | ICD-10-CM

## 2020-09-03 DIAGNOSIS — L700 Acne vulgaris: Secondary | ICD-10-CM | POA: Diagnosis not present

## 2020-09-03 MED ORDER — WINLEVI 1 % EX CREA: TOPICAL_CREAM | CUTANEOUS | 3 refills | Status: DC

## 2020-09-03 NOTE — Progress Notes (Signed)
   Follow-Up Visit   Subjective  Jocelyn Palmer is a 50 y.o. female who presents for the following: Seborrheic Dermatitis (Improved. Currently using ketoconazole 2% shampoo and Halog solution prn flares. She has recently had a few flares on her face.) and Androgenetic alopecia (Improved. Currently using Rogain 5% solution and Spironolactone 100mg  daily. ). Patient is moving to in May. Has some concerns about side effects of spironolactone.  Wants to stop it, but concerned about worsening of hair thinning and of acne.  The following portions of the chart were reviewed this encounter and updated as appropriate:       Review of Systems:  No other skin or systemic complaints except as noted in HPI or Assessment and Plan.  Objective  Well appearing patient in no apparent distress; mood and affect are within normal limits.  A focused examination was performed including face, scalp. Relevant physical exam findings are noted in the Assessment and Plan.  Objective  Scalp: Hair thinning on crown with widening of part.  Appears slightly improved compared to previous photos.  BP 112/73  Images    Objective  Scalp: Clear today.  Objective  face: Few small inflammatory papules jaw   Assessment & Plan  Androgenetic alopecia Scalp  Improving. Photos from previous visit compared.  Continue Rogaine 5% Foam QHS. May d/c Spironolactone due to possible side effects. Will restart if condition worsens.  Recommend patient continue a good diet with protein and iron.    Seborrheic dermatitis Scalp  Improved.  Continue ketoconazole 2% shampoo - massage into scalp and let sit several minutes before rinsing.  Continue Halog solution to AA scalp prn flares.  Other Related Medications ketoconazole (NIZORAL) 2 % shampoo  Acne vulgaris face  Start Winlevi Cream Apply to face twice daily for acne 60g 3Rf. Rx sent to [facility].  Clascoterone (WINLEVI) 1 % CREA  - face  Return if symptoms worsen or fail to improve.   [facility], CMA, am acting as scribe for Cherlyn Labella, MD .  Documentation: I have reviewed the above documentation for accuracy and completeness, and I agree with the above.  Willeen Niece MD

## 2020-09-09 ENCOUNTER — Other Ambulatory Visit: Payer: Self-pay

## 2020-09-09 ENCOUNTER — Emergency Department
Admission: EM | Admit: 2020-09-09 | Discharge: 2020-09-09 | Disposition: A | Discharge status: Home / Self Care | Payer: 59 | Attending: Emergency Medicine | Admitting: Emergency Medicine

## 2020-09-09 ENCOUNTER — Emergency Department: Payer: 59

## 2020-09-09 DIAGNOSIS — R600 Localized edema: Secondary | ICD-10-CM | POA: Diagnosis present

## 2020-09-09 DIAGNOSIS — I82412 Acute embolism and thrombosis of left femoral vein: Secondary | ICD-10-CM | POA: Insufficient documentation

## 2020-09-09 DIAGNOSIS — D682 Hereditary deficiency of other clotting factors: Secondary | ICD-10-CM

## 2020-09-09 DIAGNOSIS — Z7901 Long term (current) use of anticoagulants: Secondary | ICD-10-CM | POA: Diagnosis not present

## 2020-09-09 NOTE — ED Notes (Signed)
Ultrasound at bedside completing exam.  

## 2020-09-09 NOTE — ED Triage Notes (Signed)
Pt comes with c/o left leg pain and possible DVT. Pt states hx of DVT. Pt is on xarelto. Pt states she noticed some swelling on Saturday and then more yesterday.
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
## 2020-09-09 NOTE — ED Notes (Signed)
Patient transported to Ultrasound 

## 2020-09-24 ENCOUNTER — Other Ambulatory Visit: Payer: Self-pay | Admitting: Dermatology

## 2020-09-24 ENCOUNTER — Other Ambulatory Visit: Payer: Self-pay

## 2020-09-24 ENCOUNTER — Encounter (INDEPENDENT_AMBULATORY_CARE_PROVIDER_SITE_OTHER): Payer: Self-pay | Admitting: Vascular Surgery

## 2020-09-24 ENCOUNTER — Ambulatory Visit (INDEPENDENT_AMBULATORY_CARE_PROVIDER_SITE_OTHER): Payer: 59 | Admitting: Vascular Surgery

## 2020-09-24 DIAGNOSIS — I890 Lymphedema, not elsewhere classified: Secondary | ICD-10-CM

## 2020-09-24 DIAGNOSIS — I87009 Postthrombotic syndrome without complications of unspecified extremity: Secondary | ICD-10-CM | POA: Insufficient documentation

## 2020-09-24 DIAGNOSIS — D6851 Activated protein C resistance: Secondary | ICD-10-CM | POA: Diagnosis not present

## 2020-09-24 NOTE — Assessment & Plan Note (Signed)
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  The patient is now having symptoms on the right as well as the left that correlated with decreased activity and weight gain.  She has now started wearing her compression stockings and elevating her legs but her swelling is still present.  This would represent stage II lymphedema.  She will continue her conservative measures, but a lymphedema pump would be an excellent adjuvant therapy to improve her symptoms.  We will try to get that approved today.  We will see the patient back in several months to assess her response.

## 2020-09-24 NOTE — Assessment & Plan Note (Signed)
With multiple previous DVTs.  Continue anticoagulation for life.

## 2020-09-24 NOTE — Addendum Note (Signed)
Addended by: Annice Needy on: 09/24/2020 11:18 AM   Modules accepted: Orders
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
## 2020-09-24 NOTE — Assessment & Plan Note (Signed)
Patient has a previous history of DVT on multiple occasions in the left leg and has developed some postphlebitic syndrome.  Her symptoms have been reasonably well controlled, but over the past several months they have gotten a little worse.  We have stressed the need for increasing her activity and elevating her legs.  Compression stockings daily should be continued.  I think she developed a component of lymphedema as well.  I have independently reviewed her duplex and this does not appear to be a new thrombus.  This is likely an old chronic finding of a small amount of nonocclusive thrombus in the femoral vein where she has had her previous DVTs.  I do not think we need to change her anticoagulation therapy and she should remain on Xarelto.

## 2020-09-24 NOTE — Patient Instructions (Signed)
Lymphedema  Lymphedema is swelling that is caused by the abnormal collection of lymph in the tissues under the skin. Lymph is excess fluid from the tissues in your body that is removed through the lymphatic system. This system is part of your body's defense system (immune system) and includes lymph nodes and lymph vessels. The lymph vessels collect and carry the excess fluid, fats, proteins, and waste from the tissues of the body to the bloodstream. This system also works to clean and remove bacteria and waste products from the body. Lymphedema occurs when the lymphatic system is blocked. When the lymph vessels or lymph nodes are blocked or damaged, lymph does not drain properly. This causes an abnormal buildup of lymph, which leads to swelling in the affected area. This may include the trunk area, or an arm or leg. Lymphedema cannot be cured by medicines, but various methods can be used to help reduce the swelling. What are the causes? The cause of this condition depends on the type of lymphedema that you have.  Primary lymphedema is caused by the absence of lymph vessels or having abnormal lymph vessels at birth.  Secondary lymphedema occurs when lymph vessels are blocked or damaged. Secondary lymphedema is more common. Common causes of lymph vessel blockage include: ? Skin infection, such as cellulitis. ? Infection by parasites (filariasis). ? Injury. ? Radiation therapy. ? Cancer. ? Formation of scar tissue. ? Surgery. What are the signs or symptoms? Symptoms of this condition include:  Swelling of the arm or leg.  A heavy or tight feeling in the arm or leg.  Swelling of the feet, toes, or fingers. Shoes or rings may fit more tightly than before.  Redness of the skin over the affected area.  Limited movement of the affected limb.  Sensitivity to touch or discomfort in the affected limb. How is this diagnosed? This condition may be diagnosed based on:  Your symptoms and medical  history.  A physical exam.  Bioimpedance spectroscopy. In this test, painless electrical currents are used to measure fluid levels in your body.  Imaging tests, such as: ? MRI. ? CT scan. ? Duplex ultrasound. This test uses sound waves to produce images of the vessels and the blood flow on a screen. ? Lymphoscintigraphy. In this test, a low dose of a radioactive substance is injected to trace the flow of lymph through your lymph vessels. ? Lymphangiography. In this test, a contrast dye is injected into the lymph vessel to help show blockages. How is this treated? If an underlying condition is causing the lymphedema, that condition will be treated. For example, antibiotic medicines may be used to treat an infection. Treatment for this condition will depend on the cause of your lymphedema. Treatment may include:  Complete decongestive therapy (CDT). This is done by a certified lymphedema therapist to reduce fluid congestion. This therapy includes: ? Skin care. ? Compression wrapping of the affected area. ? Manual lymph drainage. This is a special massage technique that promotes lymph drainage out of a limb. ? Specific exercises. Certain exercises can help fluid move out of the affected limb.  Compression. Various methods may be used to apply pressure to the affected limb to reduce the swelling. They include: ? Wearing compression stockings or sleeves on the affected limb. ? Wrapping the affected limb with special bandages.  Surgery. This is usually done for severe cases only. For example, surgery may be done if you have trouble moving the limb or if the swelling does not   get better with other treatments.   Follow these instructions at home: Self-care  The affected area is more likely to become injured or infected. Take these steps to help prevent infection: ? Keep the affected area clean and dry. ? Use approved creams or lotions to keep the skin moisturized. ? Protect your skin from  cuts:  Use gloves while cooking or gardening.  Do not walk barefoot.  If you shave the affected area, use an electric razor.  Do not wear tight clothes, shoes, or jewelry.  Eat a healthy diet that includes a lot of fruits and vegetables. Activity  Do exercises as told by your health care provider.  Do not sit with your legs crossed.  When possible, keep the affected limb raised (elevated) above the level of your heart.  Avoid carrying things with an arm that is affected by lymphedema. General instructions  Wear compression stockings or sleeves as told by your health care provider.  Note any changes in size of the affected limb. You may be instructed to take regular measurements and keep track of them.  Take over-the-counter and prescription medicines only as told by your health care provider.  If you were prescribed an antibiotic medicine, take or apply it as told by your health care provider. Do not stop using the antibiotic even if you start to feel better or if your condition improves.  Do not use heating pads or ice packs on the affected area.  Avoid having blood draws, IV insertions, or blood pressure checks on the affected limb.  Keep all follow-up visits. This is important. Contact a health care provider if you:  Continue to have swelling in your limb.  Have fluid leaking from the skin of your swollen limb.  Have a cut that does not heal.  Have redness or pain in the affected area.  Develop purplish spots, rash, blisters, or sores (lesions) on your affected limb. Get help right away if you:  Have new swelling in your limb that starts suddenly.  Have shortness of breath or chest pain.  Have a fever or chills. These symptoms may represent a serious problem that is an emergency. Do not wait to see if the symptoms will go away. Get medical help right away. Call your local emergency services (911 in the [place] Do not drive yourself to the  hospital. Summary  Lymphedema is swelling that is caused by the abnormal collection of lymph in the tissues under the skin.  Lymph is fluid from the tissues in your body that is removed through the lymphatic system. This system collects and carries excess fluid, fats, proteins, and wastes from the tissues of the body to the bloodstream.  Lymphedema causes swelling, pain, and redness in the affected area. This may include the trunk area, or an arm or leg.  Treatment for this condition may depend on the cause of your lymphedema. Treatment may include treating the underlying cause, complete decongestive therapy (CDT), compression methods, or surgery. This information is not intended to replace advice given to you by your health care provider. Make sure you discuss any questions you have with your health care provider. Document Revised: 04/24/2020 Document Reviewed: 04/24/2020 [employer] Patient Education  2021 [employer]  

## 2020-10-14 ENCOUNTER — Other Ambulatory Visit: Payer: Self-pay | Admitting: Obstetrics and Gynecology

## 2020-10-14 DIAGNOSIS — D6851 Activated protein C resistance: Secondary | ICD-10-CM

## 2020-10-14 DIAGNOSIS — N941 Unspecified dyspareunia: Secondary | ICD-10-CM

## 2020-10-14 DIAGNOSIS — N951 Menopausal and female climacteric states: Secondary | ICD-10-CM

## 2020-10-21 ENCOUNTER — Other Ambulatory Visit: Payer: Self-pay | Admitting: Dermatology

## 2020-10-24 ENCOUNTER — Inpatient Hospital Stay: Payer: 59 | Attending: Oncology | Admitting: Oncology

## 2020-10-24 ENCOUNTER — Inpatient Hospital Stay: Payer: 59

## 2020-10-24 ENCOUNTER — Encounter: Payer: Self-pay | Admitting: Oncology

## 2020-10-24 VITALS — BP 118/85 | HR 90 | Temp 98.5°F | Resp 18 | Wt 149.0 lb

## 2020-10-24 DIAGNOSIS — Z86718 Personal history of other venous thrombosis and embolism: Secondary | ICD-10-CM | POA: Diagnosis not present

## 2020-10-24 DIAGNOSIS — I82412 Acute embolism and thrombosis of left femoral vein: Secondary | ICD-10-CM | POA: Insufficient documentation

## 2020-10-24 DIAGNOSIS — Z809 Family history of malignant neoplasm, unspecified: Secondary | ICD-10-CM | POA: Insufficient documentation

## 2020-10-24 DIAGNOSIS — I82402 Acute embolism and thrombosis of unspecified deep veins of left lower extremity: Secondary | ICD-10-CM

## 2020-10-24 DIAGNOSIS — Z7901 Long term (current) use of anticoagulants: Secondary | ICD-10-CM | POA: Diagnosis not present

## 2020-10-24 DIAGNOSIS — D6851 Activated protein C resistance: Secondary | ICD-10-CM | POA: Insufficient documentation

## 2020-10-24 NOTE — Progress Notes (Signed)
New pt referred for Factor V Leiden mutation.

## 2020-10-26 ENCOUNTER — Encounter: Payer: Self-pay | Admitting: Oncology
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
## 2020-10-28 ENCOUNTER — Other Ambulatory Visit: Payer: Self-pay | Admitting: Obstetrics and Gynecology

## 2020-10-28 DIAGNOSIS — N951 Menopausal and female climacteric states: Secondary | ICD-10-CM

## 2020-10-28 MED ORDER — ESTROGENS, CONJUGATED 0.625 MG/GM VA CREA: 0.5000 | TOPICAL_CREAM | VAGINAL | 3 refills | Status: AC

## 2020-11-18 ENCOUNTER — Encounter: Payer: Self-pay | Admitting: Dermatology

## 2020-11-18 ENCOUNTER — Other Ambulatory Visit: Payer: Self-pay | Admitting: Dermatology

## 2020-11-18 MED ORDER — SPIRONOLACTONE 50 MG PO TABS: ORAL_TABLET | ORAL | 2 refills | Status: DC

## 2020-11-20 ENCOUNTER — Other Ambulatory Visit: Payer: Self-pay

## 2020-11-20 MED ORDER — SPIRONOLACTONE 50 MG PO TABS: ORAL_TABLET | ORAL | 2 refills | Status: DC

## 2020-11-20 NOTE — Progress Notes (Signed)
RX switched to [facility] due to fax request.

## 2020-11-30 ENCOUNTER — Encounter (INDEPENDENT_AMBULATORY_CARE_PROVIDER_SITE_OTHER): Payer: Self-pay

## 2020-12-24 ENCOUNTER — Other Ambulatory Visit: Payer: Self-pay

## 2020-12-24 ENCOUNTER — Ambulatory Visit (INDEPENDENT_AMBULATORY_CARE_PROVIDER_SITE_OTHER): Payer: 59 | Admitting: Vascular Surgery

## 2020-12-24 VITALS — BP 109/68 | HR 64 | Resp 16 | Wt 151.6 lb

## 2020-12-24 DIAGNOSIS — I87009 Postthrombotic syndrome without complications of unspecified extremity: Secondary | ICD-10-CM | POA: Diagnosis not present

## 2020-12-24 DIAGNOSIS — D6851 Activated protein C resistance: Secondary | ICD-10-CM

## 2020-12-24 DIAGNOSIS — I890 Lymphedema, not elsewhere classified: Secondary | ICD-10-CM
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
## 2020-12-24 NOTE — Assessment & Plan Note (Signed)
Symptoms are stable.  Continue current regimen.  She has moved away, so she will just contact us on an as-needed basis at this point.

## 2020-12-24 NOTE — Assessment & Plan Note (Signed)
Doing well with compression, elevation, and the lymphedema pump.  Return as needed.

## 2020-12-24 NOTE — Assessment & Plan Note (Signed)
On lifetime anticoagulation.

## 2020-12-27 ENCOUNTER — Ambulatory Visit (INDEPENDENT_AMBULATORY_CARE_PROVIDER_SITE_OTHER): Payer: 59 | Admitting: Obstetrics and Gynecology

## 2020-12-27 ENCOUNTER — Other Ambulatory Visit: Payer: Self-pay

## 2020-12-27 ENCOUNTER — Encounter: Payer: Self-pay | Admitting: Obstetrics and Gynecology

## 2020-12-27 VITALS — BP 100/60 | Ht 65.0 in | Wt 153.0 lb

## 2020-12-27 DIAGNOSIS — F5231 Female orgasmic disorder: Secondary | ICD-10-CM
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
## 2020-12-28 ENCOUNTER — Encounter: Payer: Self-pay | Admitting: Obstetrics and Gynecology

## 2021-05-15 ENCOUNTER — Other Ambulatory Visit: Payer: Self-pay | Admitting: Dermatology

## 2022-05-11 ENCOUNTER — Encounter (INDEPENDENT_AMBULATORY_CARE_PROVIDER_SITE_OTHER): Payer: Self-pay
